# Patient Record
Sex: Female | Born: 1935 | Race: White | Hispanic: No | Marital: Married | Smoking: Never smoker
Health system: Southern US, Community
[De-identification: ages and names within clinical notes are randomized; demographics above are authoritative.]

## PROBLEM LIST (undated history)

## (undated) DIAGNOSIS — I340 Nonrheumatic mitral (valve) insufficiency: Secondary | ICD-10-CM

## (undated) DIAGNOSIS — I4510 Unspecified right bundle-branch block: Secondary | ICD-10-CM

## (undated) DIAGNOSIS — E039 Hypothyroidism, unspecified: Secondary | ICD-10-CM

## (undated) DIAGNOSIS — I493 Ventricular premature depolarization: Secondary | ICD-10-CM

## (undated) DIAGNOSIS — J302 Other seasonal allergic rhinitis: Secondary | ICD-10-CM

## (undated) DIAGNOSIS — I10 Essential (primary) hypertension: Secondary | ICD-10-CM

## (undated) HISTORY — PX: ABDOMINAL HYSTERECTOMY: SHX81

## (undated) HISTORY — PX: ROTATOR CUFF REPAIR: SHX139

## (undated) HISTORY — PX: CATARACT EXTRACTION: SUR2

## (undated) HISTORY — PX: OTHER SURGICAL HISTORY: SHX169

---

## 2005-02-08 ENCOUNTER — Ambulatory Visit: Payer: Self-pay | Admitting: Internal Medicine

## 2005-07-15 ENCOUNTER — Ambulatory Visit: Payer: Self-pay | Admitting: Internal Medicine

## 2005-08-08 ENCOUNTER — Ambulatory Visit: Payer: Self-pay | Admitting: Internal Medicine

## 2005-08-16 ENCOUNTER — Ambulatory Visit: Payer: Self-pay | Admitting: Gastroenterology

## 2006-01-05 ENCOUNTER — Ambulatory Visit: Payer: Self-pay | Admitting: Internal Medicine

## 2006-01-16 ENCOUNTER — Ambulatory Visit: Payer: Self-pay | Admitting: Internal Medicine

## 2006-02-10 ENCOUNTER — Ambulatory Visit: Payer: Self-pay

## 2006-03-13 ENCOUNTER — Ambulatory Visit: Payer: Self-pay | Admitting: Internal Medicine

## 2006-03-18 ENCOUNTER — Ambulatory Visit: Payer: Self-pay | Admitting: Internal Medicine

## 2006-05-12 ENCOUNTER — Ambulatory Visit: Payer: Self-pay | Admitting: Internal Medicine

## 2006-05-19 ENCOUNTER — Ambulatory Visit: Payer: Self-pay | Admitting: Internal Medicine

## 2006-07-11 ENCOUNTER — Ambulatory Visit: Payer: Self-pay | Admitting: Internal Medicine

## 2006-07-18 ENCOUNTER — Ambulatory Visit: Payer: Self-pay | Admitting: Internal Medicine

## 2007-02-14 ENCOUNTER — Ambulatory Visit: Payer: Self-pay | Admitting: Internal Medicine

## 2007-10-15 ENCOUNTER — Ambulatory Visit: Payer: Self-pay | Admitting: Internal Medicine

## 2008-02-15 ENCOUNTER — Ambulatory Visit: Payer: Self-pay | Admitting: Internal Medicine

## 2009-02-16 ENCOUNTER — Ambulatory Visit: Payer: Self-pay | Admitting: Internal Medicine

## 2010-01-06 ENCOUNTER — Ambulatory Visit: Payer: Self-pay | Admitting: Ophthalmology

## 2010-01-12 ENCOUNTER — Ambulatory Visit: Payer: Self-pay | Admitting: Internal Medicine

## 2010-01-15 ENCOUNTER — Ambulatory Visit: Payer: Self-pay | Admitting: Cardiovascular Disease

## 2010-02-01 ENCOUNTER — Ambulatory Visit: Payer: Self-pay | Admitting: Ophthalmology

## 2010-02-18 ENCOUNTER — Ambulatory Visit: Payer: Self-pay | Admitting: Internal Medicine

## 2010-06-07 ENCOUNTER — Ambulatory Visit: Payer: Self-pay | Admitting: Ophthalmology

## 2011-03-04 ENCOUNTER — Ambulatory Visit: Payer: Self-pay | Admitting: Internal Medicine

## 2012-03-06 ENCOUNTER — Ambulatory Visit: Payer: Self-pay | Admitting: Internal Medicine

## 2013-03-11 ENCOUNTER — Ambulatory Visit: Payer: Self-pay | Admitting: Internal Medicine

## 2014-03-17 ENCOUNTER — Ambulatory Visit: Payer: Self-pay

## 2014-03-25 ENCOUNTER — Ambulatory Visit: Payer: Self-pay

## 2014-09-22 ENCOUNTER — Other Ambulatory Visit: Payer: Self-pay | Admitting: Nurse Practitioner

## 2014-09-22 DIAGNOSIS — R921 Mammographic calcification found on diagnostic imaging of breast: Secondary | ICD-10-CM

## 2015-03-19 ENCOUNTER — Ambulatory Visit: Payer: Self-pay

## 2015-03-19 ENCOUNTER — Other Ambulatory Visit: Payer: Self-pay

## 2015-03-23 ENCOUNTER — Other Ambulatory Visit: Payer: Self-pay | Admitting: Nurse Practitioner

## 2015-03-23 ENCOUNTER — Ambulatory Visit
Admission: RE | Admit: 2015-03-23 | Discharge: 2015-03-23 | Disposition: A | Discharge status: Home / Self Care | Payer: Commercial Managed Care - HMO | Source: Ambulatory Visit | Attending: Nurse Practitioner | Admitting: Nurse Practitioner

## 2015-03-23 DIAGNOSIS — R921 Mammographic calcification found on diagnostic imaging of breast: Secondary | ICD-10-CM

## 2016-03-07 ENCOUNTER — Other Ambulatory Visit: Payer: Self-pay | Admitting: Internal Medicine

## 2016-03-07 DIAGNOSIS — R921 Mammographic calcification found on diagnostic imaging of breast: Secondary | ICD-10-CM

## 2016-04-12 ENCOUNTER — Other Ambulatory Visit: Payer: Self-pay | Admitting: Internal Medicine

## 2016-04-12 ENCOUNTER — Ambulatory Visit
Admission: RE | Admit: 2016-04-12 | Discharge: 2016-04-12 | Disposition: A | Discharge status: Home / Self Care | Payer: Commercial Managed Care - HMO | Source: Ambulatory Visit | Attending: Internal Medicine | Admitting: Internal Medicine

## 2016-04-12 DIAGNOSIS — R921 Mammographic calcification found on diagnostic imaging of breast: Secondary | ICD-10-CM

## 2016-04-14 ENCOUNTER — Other Ambulatory Visit: Payer: Self-pay | Admitting: Internal Medicine

## 2016-04-14 DIAGNOSIS — R928 Other abnormal and inconclusive findings on diagnostic imaging of breast: Secondary | ICD-10-CM

## 2016-04-14 DIAGNOSIS — R921 Mammographic calcification found on diagnostic imaging of breast: Secondary | ICD-10-CM

## 2016-04-27 ENCOUNTER — Ambulatory Visit
Admission: RE | Admit: 2016-04-27 | Discharge: 2016-04-27 | Disposition: A | Discharge status: Home / Self Care | Payer: Medicare HMO | Source: Ambulatory Visit | Attending: Internal Medicine | Admitting: Internal Medicine

## 2016-04-27 DIAGNOSIS — R928 Other abnormal and inconclusive findings on diagnostic imaging of breast: Secondary | ICD-10-CM

## 2016-04-27 DIAGNOSIS — R921 Mammographic calcification found on diagnostic imaging of breast: Secondary | ICD-10-CM

## 2016-04-27 HISTORY — PX: BREAST BIOPSY: SHX20

## 2016-04-28 LAB — SURGICAL PATHOLOGY

## 2016-12-13 ENCOUNTER — Other Ambulatory Visit: Payer: Self-pay | Admitting: Internal Medicine

## 2016-12-13 DIAGNOSIS — R270 Ataxia, unspecified: Secondary | ICD-10-CM

## 2016-12-30 ENCOUNTER — Ambulatory Visit
Admission: RE | Admit: 2016-12-30 | Discharge: 2016-12-30 | Disposition: A | Discharge status: Home / Self Care | Payer: Medicare HMO | Source: Ambulatory Visit | Attending: Internal Medicine | Admitting: Internal Medicine

## 2016-12-30 DIAGNOSIS — R270 Ataxia, unspecified: Secondary | ICD-10-CM | POA: Insufficient documentation

## 2017-04-20 ENCOUNTER — Other Ambulatory Visit: Payer: Self-pay | Admitting: Internal Medicine

## 2017-04-20 DIAGNOSIS — Z1231 Encounter for screening mammogram for malignant neoplasm of breast: Secondary | ICD-10-CM

## 2017-05-15 ENCOUNTER — Ambulatory Visit
Admission: RE | Admit: 2017-05-15 | Discharge: 2017-05-15 | Disposition: A | Discharge status: Home / Self Care | Payer: Medicare HMO | Source: Ambulatory Visit | Attending: Internal Medicine | Admitting: Internal Medicine

## 2017-05-15 DIAGNOSIS — Z1231 Encounter for screening mammogram for malignant neoplasm of breast: Secondary | ICD-10-CM | POA: Insufficient documentation

## 2018-04-23 ENCOUNTER — Other Ambulatory Visit: Payer: Self-pay | Admitting: Internal Medicine

## 2018-04-23 DIAGNOSIS — Z1231 Encounter for screening mammogram for malignant neoplasm of breast: Secondary | ICD-10-CM

## 2018-05-22 ENCOUNTER — Ambulatory Visit
Admission: RE | Admit: 2018-05-22 | Discharge: 2018-05-22 | Disposition: A | Discharge status: Home / Self Care | Payer: Medicare HMO | Source: Ambulatory Visit | Attending: Internal Medicine | Admitting: Internal Medicine

## 2018-05-22 DIAGNOSIS — Z1231 Encounter for screening mammogram for malignant neoplasm of breast: Secondary | ICD-10-CM | POA: Diagnosis not present

## 2018-07-01 ENCOUNTER — Inpatient Hospital Stay
Admission: EM | Admit: 2018-07-01 | Discharge: 2018-07-03 | DRG: 287 | Disposition: A | Discharge status: Home / Self Care | Payer: Medicare HMO | Attending: Internal Medicine | Admitting: Internal Medicine

## 2018-07-01 ENCOUNTER — Other Ambulatory Visit: Payer: Self-pay

## 2018-07-01 ENCOUNTER — Emergency Department: Payer: Medicare HMO

## 2018-07-01 DIAGNOSIS — Z7982 Long term (current) use of aspirin: Secondary | ICD-10-CM

## 2018-07-01 DIAGNOSIS — I248 Other forms of acute ischemic heart disease: Secondary | ICD-10-CM | POA: Diagnosis present

## 2018-07-01 DIAGNOSIS — I10 Essential (primary) hypertension: Secondary | ICD-10-CM | POA: Diagnosis present

## 2018-07-01 DIAGNOSIS — F4320 Adjustment disorder, unspecified: Secondary | ICD-10-CM | POA: Diagnosis present

## 2018-07-01 DIAGNOSIS — Z23 Encounter for immunization: Secondary | ICD-10-CM

## 2018-07-01 DIAGNOSIS — I5181 Takotsubo syndrome: Secondary | ICD-10-CM | POA: Diagnosis not present

## 2018-07-01 DIAGNOSIS — J302 Other seasonal allergic rhinitis: Secondary | ICD-10-CM | POA: Diagnosis present

## 2018-07-01 DIAGNOSIS — I214 Non-ST elevation (NSTEMI) myocardial infarction: Secondary | ICD-10-CM | POA: Diagnosis present

## 2018-07-01 DIAGNOSIS — R072 Precordial pain: Secondary | ICD-10-CM

## 2018-07-01 DIAGNOSIS — Z79890 Hormone replacement therapy (postmenopausal): Secondary | ICD-10-CM

## 2018-07-01 DIAGNOSIS — E039 Hypothyroidism, unspecified: Secondary | ICD-10-CM | POA: Diagnosis present

## 2018-07-01 DIAGNOSIS — Z79899 Other long term (current) drug therapy: Secondary | ICD-10-CM

## 2018-07-01 DIAGNOSIS — Z90710 Acquired absence of both cervix and uterus: Secondary | ICD-10-CM

## 2018-07-01 DIAGNOSIS — E782 Mixed hyperlipidemia: Secondary | ICD-10-CM | POA: Diagnosis present

## 2018-07-01 DIAGNOSIS — Z8249 Family history of ischemic heart disease and other diseases of the circulatory system: Secondary | ICD-10-CM

## 2018-07-01 DIAGNOSIS — R079 Chest pain, unspecified: Secondary | ICD-10-CM | POA: Diagnosis present

## 2018-07-01 HISTORY — DX: Other seasonal allergic rhinitis: J30.2

## 2018-07-01 HISTORY — DX: Ventricular premature depolarization: I49.3

## 2018-07-01 HISTORY — DX: Hypothyroidism, unspecified: E03.9

## 2018-07-01 HISTORY — DX: Unspecified right bundle-branch block: I45.10

## 2018-07-01 HISTORY — DX: Essential (primary) hypertension: I10

## 2018-07-01 HISTORY — DX: Nonrheumatic mitral (valve) insufficiency: I34.0

## 2018-07-01 LAB — CBC
HCT: 34.3 % — ABNORMAL LOW (ref 36.0–46.0)
Hemoglobin: 11.3 g/dL — ABNORMAL LOW (ref 12.0–15.0)
MCH: 28.8 pg (ref 26.0–34.0)
MCHC: 32.9 g/dL (ref 30.0–36.0)
MCV: 87.3 fL (ref 80.0–100.0)
Platelets: 166 10*3/uL (ref 150–400)
RBC: 3.93 MIL/uL (ref 3.87–5.11)
RDW: 13.7 % (ref 11.5–15.5)
WBC: 6 10*3/uL (ref 4.0–10.5)
nRBC: 0 % (ref 0.0–0.2)

## 2018-07-01 LAB — BASIC METABOLIC PANEL
Anion gap: 8 (ref 5–15)
BUN: 30 mg/dL — ABNORMAL HIGH (ref 8–23)
CALCIUM: 9.5 mg/dL (ref 8.9–10.3)
CO2: 27 mmol/L (ref 22–32)
CREATININE: 0.98 mg/dL (ref 0.44–1.00)
Chloride: 104 mmol/L (ref 98–111)
GFR calc Af Amer: >60 mL/min (ref >60)
GFR calc non Af Amer: 54 mL/min — ABNORMAL LOW (ref >60)
Glucose, Bld: 122 mg/dL — ABNORMAL HIGH (ref 70–99)
Potassium: 4 mmol/L (ref 3.5–5.1)
Sodium: 139 mmol/L (ref 135–145)

## 2018-07-01 LAB — TROPONIN I: Troponin I: 0.18 ng/mL (ref <0.03)

## 2018-07-01 MED ORDER — SODIUM CHLORIDE 0.9% FLUSH: 3.0000 mL | Freq: Once | INTRAVENOUS | Status: DC

## 2018-07-01 NOTE — ED Notes (Signed)
Provider made aware of trop of 0.18.

## 2018-07-01 NOTE — ED Provider Notes (Signed)
[facility] Emergency Department Provider Note  ____________________________________________  Time seen: Approximately 11:51 PM  I have reviewed the triage vital signs and the nursing notes.   HISTORY  Chief Complaint Chest Pain    HPI Michele [name] is a 84 y.o. female with a past medical history of hypothyroidism and hypertension who reports being in her usual state of health until about 9:00 PM when she learned that her son had committed suicide.  She was so upset and immediately started having central chest pain described as pressure, nonradiating, no aggravating or alleviating factors.  No diaphoresis vomiting or shortness of breath.  Never had pain like this before.  No dizziness or syncope.  She has been experiencing skipped heartbeats over the past few weeks.  No recent cardiac evaluation.      Past medical history includes hypertension and hypothyroidism   There are no active problems to display for this patient.    Past Surgical History:  Procedure Laterality Date  . BREAST BIOPSY Left 04/27/2016   FIBROADENOMATOUS CHANGES WITH STROMAL HYALINIZATION AND      Prior to Admission medications   Medication Sig Start Date End Date Taking? Authorizing Provider  amLODipine (NORVASC) 10 MG tablet Take 10 mg by mouth daily. 06/01/18  Yes [provider]  aspirin EC 81 MG tablet Take 81 mg by mouth daily.   Yes [provider]  levothyroxine (SYNTHROID, LEVOTHROID) 75 MCG tablet Take 75 mcg by mouth daily. 06/01/18  Yes [provider]     Allergies Patient has no known allergies.   Family History  Problem Relation Age of Onset  . Breast cancer Cousin     Social History Social History   Tobacco Use  . Smoking status: Not on file  Substance Use Topics  . Alcohol use: Not on file  . Drug use: Not on file  No tobacco or alcohol use.  Review of Systems  Constitutional:   No fever or chills.  ENT:   No sore  throat. No rhinorrhea. Cardiovascular: Positive as above chest pain without syncope. Respiratory:   No dyspnea or cough. Gastrointestinal:   Negative for abdominal pain, vomiting and diarrhea.  Musculoskeletal:   Negative for focal pain or swelling All other systems reviewed and are negative except as documented above in ROS and HPI.  ____________________________________________   PHYSICAL EXAM:  VITAL SIGNS: ED Triage Vitals  Enc Vitals Group     BP 07/01/18 2230 (!) 157/67     Pulse Rate 07/01/18 2230 (!) 28     Resp 07/01/18 2230 18     Temp --      Temp src --      SpO2 07/01/18 2230 99 %     Weight 07/01/18 2224 130 lb (59 kg)     Height 07/01/18 2224 5' (1.524 m)     Head Circumference --      Peak Flow --      Pain Score 07/01/18 2223 8     Pain Loc --      Pain Edu? --      Excl. in GC? --     Vital signs reviewed, nursing assessments reviewed.   Constitutional:   Alert and oriented. Non-toxic appearance. Eyes:   Conjunctivae are normal. EOMI. PERRL. ENT      Head:   Normocephalic and atraumatic.      Nose:   No congestion/rhinnorhea.       Mouth/Throat:   MMM, no pharyngeal erythema.  No peritonsillar mass.       Neck:   No meningismus. Full ROM.  Neuro nonpalpable Hematological/Lymphatic/Immunilogical:   No cervical lymphadenopathy. Cardiovascular:   Tachycardia heart rate 105.  Frequent PVCs on the monitor. Symmetric bilateral radial and DP pulses.  No murmurs. Cap refill less than 2 seconds. Respiratory:   Normal respiratory effort without tachypnea/retractions. Breath sounds are clear and equal bilaterally. No wheezes/rales/rhonchi. Gastrointestinal:   Soft and nontender. Non distended. There is no CVA tenderness.  No rebound, rigidity, or guarding.  Musculoskeletal:   Normal range of motion in all extremities. No joint effusions.  No lower extremity tenderness.  No edema. Neurologic:   Normal speech and language.  Motor grossly intact. No acute focal  neurologic deficits are appreciated.  Skin:    Skin is warm, dry and intact. No rash noted.  No petechiae, purpura, or bullae.  ____________________________________________    LABS (pertinent positives/negatives) (all labs ordered are listed, but only abnormal results are displayed) Labs Reviewed  BASIC METABOLIC PANEL - Abnormal; Notable for the following components:      Result Value   Glucose, Bld 122 (*)    BUN 30 (*)    GFR calc non Af Amer 54 (*)    All other components within normal limits  CBC - Abnormal; Notable for the following components:   Hemoglobin 11.3 (*)    HCT 34.3 (*)    All other components within normal limits  TROPONIN I - Abnormal; Notable for the following components:   Troponin I 0.18 (*)    All other components within normal limits  MAGNESIUM   ____________________________________________   EKG  Interpreted by me Sinus tachycardia rate 103, right axis, right bundle branch block.  3 PVCs on the strip, no acute ischemic changes.  ____________________________________________    RADIOLOGY  Dg Chest 2 View  Result Date: 07/01/2018 CLINICAL DATA:  Substernal chest pain EXAM: CHEST - 2 VIEW COMPARISON:  01/12/2010 FINDINGS: There is hyperinflation of the lungs compatible with COPD. Scarring in the upper lobes and lung bases bilaterally. No acute confluent airspace opacities. Heart is borderline in size. No effusions or acute bony abnormality. IMPRESSION: COPD/chronic changes.  No active disease. Electronically Signed   By: Charlett NoseKevin  Dover M.D.   On: 07/01/2018 22:51    ____________________________________________   PROCEDURES .Critical Care Performed by: Sharman CheekStafford, Karmine Kauer, MD Authorized by: Sharman CheekStafford, Ranie Chinchilla, MD   Critical care provider statement:    Critical care time (minutes):  30   Critical care time was exclusive of:  Separately billable procedures and treating other patients   Critical care was necessary to treat or prevent imminent or  life-threatening deterioration of the following conditions:  Cardiac failure   Critical care was time spent personally by me on the following activities:  Development of treatment plan with patient or surrogate, discussions with consultants, evaluation of patient's response to treatment, examination of patient, obtaining history from patient or surrogate, ordering and performing treatments and interventions, ordering and review of laboratory studies, ordering and review of radiographic studies, pulse oximetry, re-evaluation of patient's condition and review of old charts    ____________________________________________  DIFFERENTIAL DIAGNOSIS   Non-STEMI, Takotsubo cardiomyopathy, acute stress reaction, hyperthyroidism  CLINICAL IMPRESSION / ASSESSMENT AND PLAN / ED COURSE  Medications ordered in the ED: Medications  sodium chloride flush (NS) 0.9 % injection 3 mL (has no administration in time range)    Pertinent labs & imaging results that were available during my care of the patient were  reviewed by me and considered in my medical decision making (see chart for details).    Patient presents with central chest pain starting at 9 PM tonight after learning of her son's suicide.  Given aspirin by EMS.  Ongoing chest pain now, troponin elevated to 0.18, patient will require overnight heparin, serial troponin, cardiac work-up.  Will discuss with hospitalist for further evaluation.      ____________________________________________   FINAL CLINICAL IMPRESSION(S) / ED DIAGNOSES    Final diagnoses:  NSTEMI (non-ST elevated myocardial infarction) (HCC)  Precordial pain     ED Discharge Orders    None      Portions of this note were generated with dragon dictation software. Dictation errors may occur despite best attempts at proofreading.   Sharman Cheek, MD 07/01/18 2355

## 2018-07-01 NOTE — ED Notes (Signed)
Unhooked pt from monitor to use bathroom. Pt had steady gait and did not need assistance. Hooked pt back up to monitor when done.

## 2018-07-01 NOTE — ED Notes (Signed)
Pt stated that she started having substernal chest pain that started around 2100. Pt stated that the sheriff officer came to her house and informed her that her son was dead. Pt stated that the pain has been constant since then but will easy up some. Denies any n/v or SOB. Pt stated that earlier this month she went to her PCP and he told her that her heart was skipping beats and she does have a cardiology appt next month.

## 2018-07-01 NOTE — ED Triage Notes (Signed)
Pt arrived via [facility] from home with substernal CP after being informed that her son has died. Pt was supposed to see cardiology later next week. Pt visibly upset but cooperative.

## 2018-07-02 ENCOUNTER — Encounter
Admission: EM | Disposition: A | Discharge status: Home / Self Care | Payer: Self-pay | Source: Home / Self Care | Attending: Internal Medicine

## 2018-07-02 ENCOUNTER — Encounter: Payer: Self-pay | Admitting: Family Medicine

## 2018-07-02 ENCOUNTER — Other Ambulatory Visit: Payer: Self-pay

## 2018-07-02 DIAGNOSIS — R072 Precordial pain: Secondary | ICD-10-CM | POA: Diagnosis present

## 2018-07-02 DIAGNOSIS — R079 Chest pain, unspecified: Secondary | ICD-10-CM | POA: Diagnosis not present

## 2018-07-02 DIAGNOSIS — I5181 Takotsubo syndrome: Secondary | ICD-10-CM | POA: Diagnosis present

## 2018-07-02 DIAGNOSIS — E782 Mixed hyperlipidemia: Secondary | ICD-10-CM | POA: Diagnosis present

## 2018-07-02 DIAGNOSIS — I10 Essential (primary) hypertension: Secondary | ICD-10-CM | POA: Diagnosis present

## 2018-07-02 DIAGNOSIS — Z8249 Family history of ischemic heart disease and other diseases of the circulatory system: Secondary | ICD-10-CM | POA: Diagnosis not present

## 2018-07-02 DIAGNOSIS — Z79890 Hormone replacement therapy (postmenopausal): Secondary | ICD-10-CM | POA: Diagnosis not present

## 2018-07-02 DIAGNOSIS — I214 Non-ST elevation (NSTEMI) myocardial infarction: Secondary | ICD-10-CM | POA: Diagnosis present

## 2018-07-02 DIAGNOSIS — Z90710 Acquired absence of both cervix and uterus: Secondary | ICD-10-CM | POA: Diagnosis not present

## 2018-07-02 DIAGNOSIS — E039 Hypothyroidism, unspecified: Secondary | ICD-10-CM | POA: Diagnosis present

## 2018-07-02 DIAGNOSIS — Z23 Encounter for immunization: Secondary | ICD-10-CM | POA: Diagnosis present

## 2018-07-02 DIAGNOSIS — J302 Other seasonal allergic rhinitis: Secondary | ICD-10-CM | POA: Diagnosis present

## 2018-07-02 DIAGNOSIS — R7989 Other specified abnormal findings of blood chemistry: Secondary | ICD-10-CM | POA: Diagnosis not present

## 2018-07-02 DIAGNOSIS — F4320 Adjustment disorder, unspecified: Secondary | ICD-10-CM | POA: Diagnosis present

## 2018-07-02 DIAGNOSIS — Z7982 Long term (current) use of aspirin: Secondary | ICD-10-CM | POA: Diagnosis not present

## 2018-07-02 DIAGNOSIS — I248 Other forms of acute ischemic heart disease: Secondary | ICD-10-CM | POA: Diagnosis present

## 2018-07-02 DIAGNOSIS — Z79899 Other long term (current) drug therapy: Secondary | ICD-10-CM | POA: Diagnosis not present

## 2018-07-02 HISTORY — PX: LEFT HEART CATH AND CORONARY ANGIOGRAPHY: CATH118249

## 2018-07-02 LAB — TROPONIN I
Troponin I: 3.36 ng/mL (ref <0.03)
Troponin I: 3.15 ng/mL (ref <0.03)

## 2018-07-02 LAB — HEPATIC FUNCTION PANEL
ALT: 20 U/L (ref 0–44)
AST: 32 U/L (ref 15–41)
Albumin: 4.2 g/dL (ref 3.5–5.0)
Alkaline Phosphatase: 47 U/L (ref 38–126)
Bilirubin, Direct: 0.1 mg/dL (ref 0.0–0.2)
Indirect Bilirubin: 0.2 mg/dL — ABNORMAL LOW (ref 0.3–0.9)
Total Bilirubin: 0.3 mg/dL (ref 0.3–1.2)
Total Protein: 6.5 g/dL (ref 6.5–8.1)

## 2018-07-02 LAB — CBC
HCT: 31.5 % — ABNORMAL LOW (ref 36.0–46.0)
HEMOGLOBIN: 10.4 g/dL — AB (ref 12.0–15.0)
MCH: 28.7 pg (ref 26.0–34.0)
MCHC: 33 g/dL (ref 30.0–36.0)
MCV: 86.8 fL (ref 80.0–100.0)
Platelets: 159 10*3/uL (ref 150–400)
RBC: 3.63 MIL/uL — ABNORMAL LOW (ref 3.87–5.11)
RDW: 13.8 % (ref 11.5–15.5)
WBC: 7.3 10*3/uL (ref 4.0–10.5)
nRBC: 0 % (ref 0.0–0.2)

## 2018-07-02 LAB — MAGNESIUM: Magnesium: 2.2 mg/dL (ref 1.7–2.4)

## 2018-07-02 LAB — LIPID PANEL
CHOL/HDL RATIO: 1.8 ratio
Cholesterol: 162 mg/dL (ref 0–200)
HDL: 91 mg/dL (ref >40)
LDL Cholesterol: 66 mg/dL (ref 0–99)
Triglycerides: 27 mg/dL (ref <150)
VLDL: 5 mg/dL (ref 0–40)

## 2018-07-02 LAB — APTT: APTT: 38 s — AB (ref 24–36)

## 2018-07-02 LAB — TSH: TSH: 2.541 u[IU]/mL (ref 0.350–4.500)

## 2018-07-02 LAB — PHOSPHORUS: Phosphorus: 3.2 mg/dL (ref 2.5–4.6)

## 2018-07-02 LAB — PROTIME-INR
INR: 1 (ref 0.8–1.2)
Prothrombin Time: 12.8 seconds (ref 11.4–15.2)

## 2018-07-02 LAB — BRAIN NATRIURETIC PEPTIDE: B Natriuretic Peptide: 109 pg/mL — ABNORMAL HIGH (ref 0.0–100.0)

## 2018-07-02 SURGERY — LEFT HEART CATH AND CORONARY ANGIOGRAPHY
Anesthesia: Moderate Sedation

## 2018-07-02 MED ORDER — SODIUM CHLORIDE 0.9% FLUSH: 3.0000 mL | INTRAVENOUS | Status: DC | PRN

## 2018-07-02 MED ORDER — MORPHINE SULFATE (PF) 2 MG/ML IV SOLN: 2.0000 mg | INTRAVENOUS | Status: DC | PRN

## 2018-07-02 MED ORDER — SODIUM CHLORIDE 0.9% FLUSH: 3.0000 mL | Freq: Two times a day (BID) | INTRAVENOUS | Status: DC

## 2018-07-02 MED ORDER — FUROSEMIDE 10 MG/ML IJ SOLN
20.0000 mg | Freq: Once | INTRAMUSCULAR | Status: AC
  Administered 2018-07-02: 20 mg via INTRAVENOUS

## 2018-07-02 MED ORDER — MORPHINE SULFATE (PF) 2 MG/ML IV SOLN
2.0000 mg | INTRAVENOUS | Status: DC | PRN
  Administered 2018-07-02: 2 mg via INTRAVENOUS
  Filled 2018-07-02: qty 1

## 2018-07-02 MED ORDER — ONDANSETRON HCL 4 MG/2ML IJ SOLN: 4.0000 mg | Freq: Four times a day (QID) | INTRAMUSCULAR | Status: DC | PRN

## 2018-07-02 MED ORDER — LEVOTHYROXINE SODIUM 50 MCG PO TABS
75.0000 ug | ORAL_TABLET | Freq: Every day | ORAL | Status: DC
  Administered 2018-07-02 – 2018-07-03 (×2): 75 ug via ORAL
  Filled 2018-07-02: qty 1
  Filled 2018-07-02: qty 2

## 2018-07-02 MED ORDER — PANTOPRAZOLE SODIUM 40 MG PO TBEC
40.0000 mg | DELAYED_RELEASE_TABLET | Freq: Every day | ORAL | Status: DC
  Administered 2018-07-03: 40 mg via ORAL
  Filled 2018-07-02: qty 1

## 2018-07-02 MED ORDER — PNEUMOCOCCAL VAC POLYVALENT 25 MCG/0.5ML IJ INJ
0.5000 mL | INJECTION | INTRAMUSCULAR | Status: AC
  Administered 2018-07-03: 0.5 mL via INTRAMUSCULAR
  Filled 2018-07-02: qty 0.5

## 2018-07-02 MED ORDER — NITROGLYCERIN 0.4 MG SL SUBL: 0.4000 mg | SUBLINGUAL_TABLET | SUBLINGUAL | Status: DC | PRN

## 2018-07-02 MED ORDER — SODIUM CHLORIDE 0.9 % WEIGHT BASED INFUSION
1.0000 mL/kg/h | INTRAVENOUS | Status: AC
  Administered 2018-07-02: 1 mL/kg/h via INTRAVENOUS

## 2018-07-02 MED ORDER — FENTANYL CITRATE (PF) 100 MCG/2ML IJ SOLN
INTRAMUSCULAR | Status: AC
  Filled 2018-07-02: qty 2

## 2018-07-02 MED ORDER — NITROGLYCERIN 2 % TD OINT
0.5000 [in_us] | TOPICAL_OINTMENT | Freq: Four times a day (QID) | TRANSDERMAL | Status: DC
  Administered 2018-07-02: 0.5 [in_us] via TOPICAL
  Filled 2018-07-02 (×2): qty 1

## 2018-07-02 MED ORDER — SODIUM CHLORIDE 0.9 % WEIGHT BASED INFUSION: 1.0000 mL/kg/h | INTRAVENOUS | Status: DC

## 2018-07-02 MED ORDER — HEPARIN (PORCINE) IN NACL 1000-0.9 UT/500ML-% IV SOLN
INTRAVENOUS | Status: DC | PRN
  Administered 2018-07-02 (×2): 500 mL

## 2018-07-02 MED ORDER — SODIUM CHLORIDE 0.9 % IV SOLN: 250.0000 mL | INTRAVENOUS | Status: DC | PRN

## 2018-07-02 MED ORDER — FUROSEMIDE 20 MG PO TABS: 20.0000 mg | ORAL_TABLET | Freq: Every day | ORAL | Status: DC

## 2018-07-02 MED ORDER — MIDAZOLAM HCL 2 MG/2ML IJ SOLN
INTRAMUSCULAR | Status: DC | PRN
  Administered 2018-07-02: 1 mg via INTRAVENOUS

## 2018-07-02 MED ORDER — AMLODIPINE BESYLATE 5 MG PO TABS: 10.0000 mg | ORAL_TABLET | Freq: Every day | ORAL | Status: DC

## 2018-07-02 MED ORDER — ACETAMINOPHEN 325 MG PO TABS: 650.0000 mg | ORAL_TABLET | ORAL | Status: DC | PRN

## 2018-07-02 MED ORDER — FENTANYL CITRATE (PF) 100 MCG/2ML IJ SOLN
INTRAMUSCULAR | Status: DC | PRN
  Administered 2018-07-02: 25 ug via INTRAVENOUS

## 2018-07-02 MED ORDER — MIDAZOLAM HCL 2 MG/2ML IJ SOLN
INTRAMUSCULAR | Status: AC
  Filled 2018-07-02: qty 2

## 2018-07-02 MED ORDER — MORPHINE SULFATE (PF) 4 MG/ML IV SOLN: 4.0000 mg | INTRAVENOUS | Status: DC | PRN

## 2018-07-02 MED ORDER — ASPIRIN EC 325 MG PO TBEC
325.0000 mg | DELAYED_RELEASE_TABLET | Freq: Every day | ORAL | Status: DC
  Administered 2018-07-03: 325 mg via ORAL
  Filled 2018-07-02: qty 1

## 2018-07-02 MED ORDER — ZOLPIDEM TARTRATE 5 MG PO TABS
5.0000 mg | ORAL_TABLET | Freq: Every evening | ORAL | Status: DC | PRN
  Filled 2018-07-02: qty 1

## 2018-07-02 MED ORDER — HEPARIN (PORCINE) 25000 UT/250ML-% IV SOLN
700.0000 [IU]/h | INTRAVENOUS | Status: DC
  Administered 2018-07-02: 700 [IU]/h via INTRAVENOUS
  Filled 2018-07-02: qty 250

## 2018-07-02 MED ORDER — ALPRAZOLAM 0.25 MG PO TABS: 0.2500 mg | ORAL_TABLET | Freq: Two times a day (BID) | ORAL | Status: DC | PRN

## 2018-07-02 MED ORDER — HEPARIN (PORCINE) IN NACL 1000-0.9 UT/500ML-% IV SOLN
INTRAVENOUS | Status: AC
  Filled 2018-07-02: qty 1000

## 2018-07-02 MED ORDER — FUROSEMIDE 10 MG/ML IJ SOLN
INTRAMUSCULAR | Status: AC
  Administered 2018-07-02: 20 mg via INTRAVENOUS
  Filled 2018-07-02: qty 2

## 2018-07-02 MED ORDER — ASPIRIN 81 MG PO CHEW: 81.0000 mg | CHEWABLE_TABLET | Freq: Every day | ORAL | Status: DC

## 2018-07-02 MED ORDER — LISINOPRIL 10 MG PO TABS
5.0000 mg | ORAL_TABLET | Freq: Every day | ORAL | Status: DC
  Administered 2018-07-02 – 2018-07-03 (×2): 5 mg via ORAL
  Filled 2018-07-02 (×2): qty 1

## 2018-07-02 MED ORDER — ATORVASTATIN CALCIUM 20 MG PO TABS
20.0000 mg | ORAL_TABLET | Freq: Every day | ORAL | Status: DC
  Administered 2018-07-02 (×2): 20 mg via ORAL
  Filled 2018-07-02 (×2): qty 1

## 2018-07-02 MED ORDER — SODIUM CHLORIDE 0.9 % IV SOLN
INTRAVENOUS | Status: DC
  Administered 2018-07-02: 08:00:00 via INTRAVENOUS

## 2018-07-02 MED ORDER — IOPAMIDOL (ISOVUE-300) INJECTION 61%
INTRAVENOUS | Status: DC | PRN
  Administered 2018-07-02: 100 mL via INTRA_ARTERIAL

## 2018-07-02 MED ORDER — SODIUM CHLORIDE 0.9 % WEIGHT BASED INFUSION: 3.0000 mL/kg/h | INTRAVENOUS | Status: DC

## 2018-07-02 MED ORDER — ASPIRIN 81 MG PO CHEW: 81.0000 mg | CHEWABLE_TABLET | ORAL | Status: DC

## 2018-07-02 MED ORDER — HEPARIN BOLUS VIA INFUSION
3600.0000 [IU] | Freq: Once | INTRAVENOUS | Status: AC
  Administered 2018-07-02: 3600 [IU] via INTRAVENOUS
  Filled 2018-07-02: qty 3600

## 2018-07-02 MED ORDER — METOPROLOL TARTRATE 25 MG PO TABS
25.0000 mg | ORAL_TABLET | Freq: Two times a day (BID) | ORAL | Status: DC
  Administered 2018-07-02 – 2018-07-03 (×3): 25 mg via ORAL
  Filled 2018-07-02 (×3): qty 1

## 2018-07-02 SURGICAL SUPPLY — 10 items
CATH INFINITI 5FR ANG PIGTAIL (CATHETERS) ×3 IMPLANT
CATH INFINITI 5FR JL4 (CATHETERS) ×3 IMPLANT
CATH INFINITI JR4 5F (CATHETERS) ×3 IMPLANT
DEVICE CLOSURE MYNXGRIP 5F (Vascular Products) ×3 IMPLANT
KIT MANI 3VAL PERCEP (MISCELLANEOUS) ×3 IMPLANT
NEEDLE PERC 18GX7CM (NEEDLE) ×3 IMPLANT
PACK CARDIAC CATH (CUSTOM PROCEDURE TRAY) ×3 IMPLANT
SHEATH AVANTI 5FR X 11CM (SHEATH) ×3 IMPLANT
WIRE GUIDERIGHT .035X150 (WIRE) ×3 IMPLANT
WIRE ROSEN-J .035X260CM (WIRE) IMPLANT

## 2018-07-02 NOTE — ED Notes (Signed)
This RN and [place], RN to bedside to do bedside handoff. Pt is alert and oriented. Pt denies any pain at this time, denies any needs at this time. Will continue to monitor for further patient needs.

## 2018-07-02 NOTE — ED Notes (Signed)
Provider Dr. Arville Care made aware of pts trop of 3.36

## 2018-07-02 NOTE — H&P (Signed)
[facility] [place] [facility]   PATIENT NAME: Michele [name]    MR#:  [id number]  DATE OF BIRTH:  05/25/1934  DATE OF ADMISSION:  07/01/2018  PRIMARY CARE PHYSICIAN: Marguarite Arbour, MD   REQUESTING/REFERRING PHYSICIAN: Alfonse Flavors, MD  CHIEF COMPLAINT:   Chief Complaint  Patient presents with  . Chest Pain    HISTORY OF PRESENT ILLNESS:  Michele [name]  is a 84 y.o. female with a known history of hypertension, hypothyroidism and seasonal allergies as well as mitral regurgitation, who presented to the emergency room with acute onset of midsternal chest pain felt as pressure and sharp pain and graded 8/10 in severity with no dyspnea or palpitations, nausea or vomiting or diaphoresis.  She denied any radiation of her pain.  Prior to pain onset that started at 9 PM she learned that her 61 year old depressed son unfortunately committed suicide.  She stated that he could not get any help.  She denies any cough or wheezing or hemoptysis.  No leg pain or edema or recent travels or surgeries.  No bleeding diathesis.  She denied any sick exposure however she has been to her church yesterday morning.  She stated that the gathering was less than 100 members in triage.  She denies any fever or chills.  No rhinorrhea or nasal congestion or sore throat or earache.  Upon presentation to the emergency room, blood pressure was 157/67, with a heart rate of 110 and later 95, respiratory rate of 18 and pulse ox entry 99% on room air.  EKG showed sinus tachycardia with rate 103, with old right bundle branch block, right axis deviation and VCs which have been seen before on previous EKG..  Her view chest x-ray showed hyperinflation with no acute cardiopulmonary disease.  Her labs were remarkable for a troponin of 0.18 and her BUN was 30 with a magnesium of 2.2 and PTT of 38 otherwise labs where within normal.  She was given 4 baby aspirin by EMS.  Here she stated that her pain is down to  6/10.  She has been given sublingual nitroglycerin.  She was also ordered a bolus of IV heparin followed by IV heparin infusion.  She will be admitted to an observation telemetry bed for further evaluation and management. PAST MEDICAL HISTORY:   Past Medical History:  Diagnosis Date  . Hypertension   . Hypothyroidism   . Mitral regurgitation   . PVC's (premature ventricular contractions)   . Right bundle branch block   . Seasonal allergies    PAST SURGICAL HISTORY:   Past Surgical History:  Procedure Laterality Date  . ABDOMINAL HYSTERECTOMY    . BREAST BIOPSY Left 04/27/2016   FIBROADENOMATOUS CHANGES WITH STROMAL HYALINIZATION AND   . CATARACT EXTRACTION    . Right elbow tendonitis    . ROTATOR CUFF REPAIR Bilateral      SOCIAL HISTORY:   Social History   Tobacco Use  . Smoking status: Not on file  Substance Use Topics  . Alcohol use: Not on file  No history of illicit substance use.  FAMILY HISTORY:   Family History  Problem Relation Age of Onset  . Breast cancer Cousin   Her brother had MI in his late 99s and her mother had CVA  DRUG ALLERGIES:  No Known Allergies  REVIEW OF SYSTEMS:   ROS As per history of present illness. All pertinent systems were reviewed above. Constitutional,  HEENT, cardiovascular, respiratory, GI, GU, musculoskeletal, neuro, psychiatric,  endocrine,  integumentary and hematologic systems were reviewed and are otherwise  negative/unremarkable except for positive findings mentioned above in the HPI.   MEDICATIONS AT HOME:   Prior to Admission medications   Medication Sig Start Date End Date Taking? Authorizing Provider  amLODipine (NORVASC) 10 MG tablet Take 10 mg by mouth daily. 06/01/18  Yes [provider]  aspirin EC 81 MG tablet Take 81 mg by mouth daily.   Yes [provider]  levothyroxine (SYNTHROID, LEVOTHROID) 75 MCG tablet Take 75 mcg by mouth daily. 06/01/18  Yes [provider]      VITAL  SIGNS:  Blood pressure (!) 131/51, pulse 87, resp. rate 17, height 5' (1.524 m), weight 59 kg, SpO2 99 %.  PHYSICAL EXAMINATION:  Physical Exam  GENERAL:  84 y.o.-year-old Caucasian female patient lying in the bed with no acute distress.  EYES: Pupils equal, round, reactive to light and accommodation. No scleral icterus. Extraocular muscles intact.  HEENT: Head atraumatic, normocephalic. Oropharynx and nasopharynx clear.  NECK:  Supple, no jugular venous distention. No thyroid enlargement, no tenderness.  LUNGS: Normal breath sounds bilaterally, no wheezing, rales,rhonchi or crepitation. No use of accessory muscles of respiration.  CARDIOVASCULAR: Regular rate and rhythm with occasional extra beat, S1, S2 normal. No murmurs, rubs, or gallops.  ABDOMEN: Soft, nontender, nondistended. Bowel sounds present. No organomegaly or mass.  EXTREMITIES: No pedal edema, cyanosis, or clubbing.  NEUROLOGIC: Cranial nerves II through XII are intact. Muscle strength 5/5 in all extremities. Sensation intact. Gait not checked.  PSYCHIATRIC: The patient is alert and oriented x 3.  She has depressed affect. SKIN: No obvious rash, lesion, or ulcer.   LABORATORY PANEL:   CBC Recent Labs  Lab 07/01/18 2228  WBC 6.0  HGB 11.3*  HCT 34.3*  PLT 166   ------------------------------------------------------------------------------------------------------------------  Chemistries  Recent Labs  Lab 06/30/18 2228 07/01/18 2228  NA  --  139  K  --  4.0  CL  --  104  CO2  --  27  GLUCOSE  --  122*  BUN  --  30*  CREATININE  --  0.98  CALCIUM  --  9.5  MG 2.2  --    ------------------------------------------------------------------------------------------------------------------  Cardiac Enzymes Recent Labs  Lab 07/01/18 2228  TROPONINI 0.18*   ------------------------------------------------------------------------------------------------------------------  RADIOLOGY:  Dg Chest 2 View   Result Date: 07/01/2018 CLINICAL DATA:  Substernal chest pain EXAM: CHEST - 2 VIEW COMPARISON:  01/12/2010 FINDINGS: There is hyperinflation of the lungs compatible with COPD. Scarring in the upper lobes and lung bases bilaterally. No acute confluent airspace opacities. Heart is borderline in size. No effusions or acute bony abnormality. IMPRESSION: COPD/chronic changes.  No active disease. Electronically Signed   By: Charlett Nose M.D.   On: 07/01/2018 22:51    IMPRESSION AND PLAN:   #1.  Chest pain with associated elevated troponin I, rule out non-STEMI/ACS.  The patient will be admitted to an observation telemetry bed.  We will follow serial cardiac enzymes and EKGs.  Her EKG shows a right bundle branch block as well as PVCs which have been seen in previous ones.  She will be placed on increased dose of aspirin as well as PRN sublingual nitroglycerin and Nitropaste and if tolerated IV morphine sulfate.  She will be continued for now on IV heparin infusion overnight.  Will obtain a cardiology consultation by Dr. Gwen Pounds whom she was supposed to see on an outpatient basis in the near future.  We will  add beta-blocker therapy with Lopressor.  Will check statin therapy and start her on p.o. Lipitor.  Will obtain a 2D echo this a.m. and defer further cardiac risk stratification to Dr. Gwen Pounds.  Her pain is certainly be related to the horrible news she had regarding her 106 year old depressed son committing suicide tonight.  She will be placed on PRN Xanax while here and will likely need further evaluation by psychiatry on outpatient basis  I contacted Dr. Gwen Pounds regarding the consultation.  2.  Hypertension.  We will continue her Norvasc.  3.  Hypothyroidism.  We will check her TSH and continue her Synthroid.  4.  Seasonal allergies.  No current flare.  5.  DVT prophylaxis.  She has been placed on IV heparin infusion after she was given a bolus in the ER.  6.  GI prophylaxis will be provided with  PPI therapy for the possibility of GI etiology.  All the records are reviewed and case discussed with ED provider. Management plans discussed with the patient and her husband all questions were answered.  They agreed to proceed with the above-mentioned plan.  Further management will depend upon hospital course.   CODE STATUS: Full code.  I discussed it with her and her husband.  She does not want to stay for [place] time on life support though.   TOTAL TIME TAKING CARE OF THIS PATIENT: 60 minutes.    Hannah Beat M.D on 07/02/2018 at 1:41 AM  Pager - [phone]  After 6pm go to [email or web address] - Social research officer, government  [facility]  Office  [phone]  CC: Primary care physician; Marguarite Arbour, MD   Note: This dictation was prepared with Dragon dictation along with smaller phrase technology. Any transcriptional errors that result from this process are unintentional.

## 2018-07-02 NOTE — Consult Note (Signed)
[facility] Cardiology Consultation Note  Patient ID: Michele [name], MRN: [id number], DOB/AGE: Oct 10, 1934 84 y.o. Admit date: 07/01/2018   Date of Consult: 07/02/2018 Primary Physician: Marguarite Arbour, MD Primary Cardiologist: Gwen Pounds  Chief Complaint:  Chief Complaint  Patient presents with  . Chest Pain   Reason for Consult: Chest pain  HPI: 84 y.o. female with known essential hypertension mixed hyperlipidemia to a minimal degree but no evidence of previous cardiovascular history having recent increase in preventricular contractions and abnormal EKG of right bundle branch block.  The patient has had no evidence of shortness of breath weakness fatigue dizziness chest pain with physical activity in the last several weeks but learned of her son's suicide yesterday.  At that time the patient had significant chest discomfort to a significant degree substernal radiating into the back associated with shortness of breath.  EKG did not change but troponin level did increase to 0.18.  At this time she was placed on heparin and other medication management and had significant improvements of symptoms.  Currently the patient is hemodynamically stable with no further's issue  Past Medical History:  Diagnosis Date  . Hypertension   . Hypothyroidism   . Mitral regurgitation   . PVC's (premature ventricular contractions)   . Right bundle branch block   . Seasonal allergies       Surgical History:  Past Surgical History:  Procedure Laterality Date  . ABDOMINAL HYSTERECTOMY    . BREAST BIOPSY Left 04/27/2016   FIBROADENOMATOUS CHANGES WITH STROMAL HYALINIZATION AND   . CATARACT EXTRACTION    . Right elbow tendonitis    . ROTATOR CUFF REPAIR Bilateral      Home Meds: Prior to Admission medications   Medication Sig Start Date End Date Taking? Authorizing Provider  amLODipine (NORVASC) 10 MG tablet Take 10 mg by mouth daily. 06/01/18  Yes [provider]  aspirin EC 81 MG tablet  Take 81 mg by mouth daily.   Yes [provider]  levothyroxine (SYNTHROID, LEVOTHROID) 75 MCG tablet Take 75 mcg by mouth daily. 06/01/18  Yes [provider]    Inpatient Medications:  . amLODipine  10 mg Oral Daily  . [START ON 07/03/2018] aspirin  81 mg Oral Pre-Cath  . aspirin EC  325 mg Oral Daily  . atorvastatin  20 mg Oral q1800  . levothyroxine  75 mcg Oral Daily  . metoprolol tartrate  25 mg Oral BID  . nitroGLYCERIN  0.5 inch Topical Q6H  . pantoprazole  40 mg Oral Daily  . sodium chloride flush  3 mL Intravenous Once  . sodium chloride flush  3 mL Intravenous Q12H   . sodium chloride    . sodium chloride    . [START ON 07/03/2018] sodium chloride     Followed by  . [START ON 07/03/2018] sodium chloride    . heparin 700 Units/hr (07/02/18 0051)    Allergies: No Known Allergies  Social History   Socioeconomic History  . Marital status: Married    Spouse name: Not on file  . Number of children: Not on file  . Years of education: Not on file  . Highest education level: Not on file  Occupational History  . Not on file  Social Needs  . Financial resource strain: Not on file  . Food insecurity:    Worry: Not on file    Inability: Not on file  . Transportation needs:    Medical: Not on file  Non-medical: Not on file  Tobacco Use  . Smoking status: Not on file  Substance and Sexual Activity  . Alcohol use: Not on file  . Drug use: Never  . Sexual activity: Not on file  Lifestyle  . Physical activity:    Days per week: Not on file    Minutes per session: Not on file  . Stress: Not on file  Relationships  . Social connections:    Talks on phone: Not on file    Gets together: Not on file    Attends religious service: Not on file    Active member of club or organization: Not on file    Attends meetings of clubs or organizations: Not on file    Relationship status: Not on file  . Intimate partner violence:    Fear of current or ex partner:  Not on file    Emotionally abused: Not on file    Physically abused: Not on file    Forced sexual activity: Not on file  Other Topics Concern  . Not on file  Social History Narrative  . Not on file     Family History  Problem Relation Age of Onset  . Breast cancer Cousin   . CVA Mother   . Coronary artery disease Brother        Status post MI in late 54s     Review of Systems Positive for chest pain Negative for: General:  chills, fever, night sweats or weight changes.  Cardiovascular: PND orthopnea syncope dizziness  Dermatological skin lesions rashes Respiratory: Cough congestion Urologic: Frequent urination urination at night and hematuria Abdominal: negative for nausea, vomiting, diarrhea, bright red blood per rectum, melena, or hematemesis Neurologic: negative for visual changes, and/or hearing changes  All other systems reviewed and are otherwise negative except as noted above.  Labs: Recent Labs    07/01/18 2228 07/02/18 0348  TROPONINI 0.18* 3.36*   Lab Results  Component Value Date   WBC 6.0 07/01/2018   HGB 11.3 (L) 07/01/2018   HCT 34.3 (L) 07/01/2018   MCV 87.3 07/01/2018   PLT 166 07/01/2018    Recent Labs  Lab 07/01/18 2228 07/02/18 0348  NA 139  --   K 4.0  --   CL 104  --   CO2 27  --   BUN 30*  --   CREATININE 0.98  --   CALCIUM 9.5  --   PROT  --  6.5  BILITOT  --  0.3  ALKPHOS  --  47  ALT  --  20  AST  --  32  GLUCOSE 122*  --    No results found for: CHOL, HDL, LDLCALC, TRIG No results found for: DDIMER  Radiology/Studies:  Dg Chest 2 View  Result Date: 07/01/2018 CLINICAL DATA:  Substernal chest pain EXAM: CHEST - 2 VIEW COMPARISON:  01/12/2010 FINDINGS: There is hyperinflation of the lungs compatible with COPD. Scarring in the upper lobes and lung bases bilaterally. No acute confluent airspace opacities. Heart is borderline in size. No effusions or acute bony abnormality. IMPRESSION: COPD/chronic changes.  No active disease.  Electronically Signed   By: Charlett Nose M.D.   On: 07/01/2018 22:51    EKG: Normal sinus rhythm with right bundle branch block and frequent preventricular contractions  Weights: Filed Weights   07/01/18 2224  Weight: 59 kg     Physical Exam: Blood pressure 116/70, pulse 78, resp. rate 14, height 5' (1.524 m), weight 59 kg, SpO2  95 %. Body mass index is 25.39 kg/m. General: Well developed, well nourished, in no acute distress. Head eyes ears nose throat: Normocephalic, atraumatic, sclera non-icteric, no xanthomas, nares are without discharge. No apparent thyromegaly and/or mass  Lungs: Normal respiratory effort.  no wheezes, no rales, no rhonchi.  Heart: RRR with normal S1 S2. no murmur gallop, no rub, PMI is normal size and placement, carotid upstroke normal without bruit, jugular venous pressure is normal Abdomen: Soft, non-tender, non-distended with normoactive bowel sounds. No hepatomegaly. No rebound/guarding. No obvious abdominal masses. Abdominal aorta is normal size without bruit Extremities: No edema. no cyanosis, no clubbing, no ulcers  Peripheral : 2+ bilateral upper extremity pulses, 2+ bilateral femoral pulses, 2+ bilateral dorsal pedal pulse Neuro: Alert and oriented. No facial asymmetry. No focal deficit. Moves all extremities spontaneously. Musculoskeletal: Normal muscle tone without kyphosis Psych:  Responds to questions appropriately with a normal affect.    Assessment: 84 year old female with essential hypertension mixed hyperlipidemia not treated with an abnormal EKG chest pain and elevated troponin consistent with non-ST elevation myocardial infarction potentially with stress-induced cardiomyopathy versus coronary atherosclerosis  Plan: 1.  Continue heparin and aspirin for further risk reduction cardiovascular event 2.  Proceed to cardiac catheterization to assess coronary anatomy and further treatment thereof is necessary.  Patient understands the risk and  benefits of cardiac catheterization.  This includes the possibility of death stroke heart attack infection bleeding or blood clot.  She is at low risk for conscious sedation  Signed, Lamar Blinks M.D. [facility] Cardiology 07/02/2018, 7:24 AM

## 2018-07-02 NOTE — Progress Notes (Signed)
ANTICOAGULATION CONSULT NOTE - Initial Consult  Pharmacy Consult for heparin Indication: chest pain/ACS  No Known Allergies  Patient Measurements: Height: 5' (152.4 cm) Weight: 130 lb (59 kg) IBW/kg (Calculated) : 45.5 Heparin Dosing Weight: 51 kg  Vital Signs: BP: 136/60 (03/16 0000) Pulse Rate: 65 (03/16 0000)  Labs: Recent Labs    07/01/18 2228  HGB 11.3*  HCT 34.3*  PLT 166  APTT 38*  LABPROT 12.8  INR 1.0  CREATININE 0.98  TROPONINI 0.18*    Estimated Creatinine Clearance: 36.2 mL/min (by C-G formula based on SCr of 0.98 mg/dL).   Medical History: No past medical history on file.  Medications:  Scheduled:  . heparin  3,600 Units Intravenous Once  . sodium chloride flush  3 mL Intravenous Once    Assessment: Patient arrives to ED for CP after learning her son had passed by suicide. Patient's initial trop was 0.18, EKG pending, CXR shows COPD no acute process. Patient is not on any anticoagulants PTA. Baseline labs drawn, aPTT slightly elevated at 38 seconds, CBC/INR WNL. Patient is being started on heparin drip for possible NSTEMI  Goal of Therapy:  Heparin level 0.3-0.7 units/ml Monitor platelets by anticoagulation protocol: Yes   Plan:  Will bolus patient w/ heparin 3600 units IV x 1 Will start rate at 700 units/hr  Will check anti-Xa @ 0900 (8 hrs post start) Will monitor daily CBC's and adjust per anti-Xa levels.  Thomasene Ripple, PharmD, BCPS Clinical Pharmacist 07/02/2018

## 2018-07-02 NOTE — ED Notes (Signed)
Pt transported to cath lab by Denny Peon, RN and Wynona Canes, RN.

## 2018-07-02 NOTE — Progress Notes (Signed)
[facility] Cardiology [facility] Encounter Note  Patient: Michele [name] / Admit Date: 07/01/2018 / Date of Encounter: 07/02/2018, 8:34 AM    Subjective: Patient now has had full resolution of chest discomfort this a.m.  EKG unchanged showing frequent preventricular contractions and right bundle branch block. Cardiac catheterization shows apical ballooning with ejection fraction of 25 to 30% and normal coronary arteries  Review of Systems: Positive for: Shortness of breath Negative for: Vision change, hearing change, syncope, dizziness, nausea, vomiting,diarrhea, bloody stool, stomach pain, cough, congestion, diaphoresis, urinary frequency, urinary pain,skin lesions, skin rashes Others previously listed  Objective: Telemetry: Sinus rhythm with frequent preventricular contractions Physical Exam: Blood pressure 133/83, pulse 90, temperature 98.4 F (36.9 C), temperature source Oral, resp. rate 15, height 5' (1.524 m), weight 59 kg, SpO2 97 %. Body mass index is 25.39 kg/m. General: Well developed, well nourished, in no acute distress. Head: Normocephalic, atraumatic, sclera non-icteric, no xanthomas, nares are without discharge. Neck: No apparent masses Lungs: Normal respirations with no wheezes, no rhonchi, no rales , no crackles   Heart: Regular rate and rhythm, normal S1 S2, no murmur, no rub, no gallop, PMI is normal size and placement, carotid upstroke normal without bruit, jugular venous pressure normal Abdomen: Soft, non-tender, non-distended with normoactive bowel sounds. No hepatosplenomegaly. Abdominal aorta is normal size without bruit Extremities: No edema, no clubbing, no cyanosis, no ulcers,  Peripheral: 2+ radial, 2+ femoral, 2+ dorsal pedal pulses Neuro: Alert and oriented. Moves all extremities spontaneously. Psych:  Responds to questions appropriately with a normal affect.  No intake or output data in the 24 hours ending 07/02/18 0834  Inpatient Medications:  . [MAR  Hold] amLODipine  10 mg Oral Daily  . [START ON 07/03/2018] aspirin  81 mg Oral Pre-Cath  . [MAR Hold] aspirin EC  325 mg Oral Daily  . [MAR Hold] atorvastatin  20 mg Oral q1800  . [MAR Hold] levothyroxine  75 mcg Oral Daily  . [MAR Hold] metoprolol tartrate  25 mg Oral BID  . [MAR Hold] nitroGLYCERIN  0.5 inch Topical Q6H  . [MAR Hold] pantoprazole  40 mg Oral Daily  . [MAR Hold] sodium chloride flush  3 mL Intravenous Once  . sodium chloride flush  3 mL Intravenous Q12H   Infusions:  . sodium chloride 75 mL/hr at 07/02/18 0753  . sodium chloride    . [START ON 07/03/2018] sodium chloride     Followed by  . [START ON 07/03/2018] sodium chloride    . heparin Stopped (07/02/18 0740)    Labs: Recent Labs    06/30/18 2228 07/01/18 2228 07/02/18 0348  NA  --  139  --   K  --  4.0  --   CL  --  104  --   CO2  --  27  --   GLUCOSE  --  122*  --   BUN  --  30*  --   CREATININE  --  0.98  --   CALCIUM  --  9.5  --   MG 2.2  --   --   PHOS  --   --  3.2   Recent Labs    07/02/18 0348  AST 32  ALT 20  ALKPHOS 47  BILITOT 0.3  PROT 6.5  ALBUMIN 4.2   Recent Labs    07/01/18 2228 07/02/18 0708  WBC 6.0 7.3  HGB 11.3* 10.4*  HCT 34.3* 31.5*  MCV 87.3 86.8  PLT 166 159   Recent  Labs    07/01/18 2228 07/02/18 0348  TROPONINI 0.18* 3.36*   Invalid input(s): POCBNP No results for input(s): HGBA1C in the last 72 hours.   Weights: Filed Weights   07/01/18 2224  Weight: 59 kg     Radiology/Studies:  Dg Chest 2 View  Result Date: 07/01/2018 CLINICAL DATA:  Substernal chest pain EXAM: CHEST - 2 VIEW COMPARISON:  01/12/2010 FINDINGS: There is hyperinflation of the lungs compatible with COPD. Scarring in the upper lobes and lung bases bilaterally. No acute confluent airspace opacities. Heart is borderline in size. No effusions or acute bony abnormality. IMPRESSION: COPD/chronic changes.  No active disease. Electronically Signed   By: Charlett Nose M.D.   On: 07/01/2018  22:51     Assessment and Recommendation  84 y.o. female with essential hypertension having significant stress and anxiety causing stress-induced cardiomyopathy and/or apical ballooning syndrome with normal coronary arteries now improving 1.  Metoprolol for LV systolic dysfunction and apical ballooning syndrome with stress-induced cardiomyopathy 2.  Consider changing amlodipine to ACE inhibitor for LV systolic dysfunction and non-ST elevation myocardial infarction 3.  No additional diuretics at this time due to no evidence of heart failure type symptoms 4.  Ambulation and follow for improvements of symptoms with adjustments of medications 5.  Further treatment options after above  Signed, Arnoldo Hooker M.D. FACC

## 2018-07-02 NOTE — ED Notes (Signed)
Trop 3.36 Provider made aware.

## 2018-07-02 NOTE — Progress Notes (Signed)
Patient ID: NYSA BROSZ, female   DOB: 1935-02-08, 84 y.o.   MRN: [id number]  [employer] PROGRESS NOTE  Michele [name] WGN:[id number] DOB: 1935-03-15 DOA: 07/01/2018 PCP: Marguarite Arbour, MD  HPI/Subjective: Patient came in with chest pain.  She just learned that her 16 year old son may have committed suicide.  In the ER her troponin went up to 3.36.  I saw her while she was in the cardiac cath recovery area.  Objective: Vitals:   07/02/18 1400 07/02/18 1430  BP: 124/67 125/76  Pulse:    Resp: 16 16  Temp:    SpO2: 95% 95%    Filed Weights   07/01/18 2224  Weight: 59 kg    ROS: Review of Systems  Constitutional: Negative for chills and fever.  Eyes: Negative for blurred vision.  Respiratory: Positive for shortness of breath. Negative for cough.   Cardiovascular: Negative for chest pain.  Gastrointestinal: Negative for abdominal pain, constipation, diarrhea, nausea and vomiting.  Genitourinary: Negative for dysuria.  Musculoskeletal: Negative for joint pain.  Neurological: Negative for dizziness and headaches.   Exam: Physical Exam  Constitutional: She is oriented to person, place, and time.  HENT:  Nose: No mucosal edema.  Mouth/Throat: No oropharyngeal exudate or posterior oropharyngeal edema.  Eyes: Pupils are equal, round, and reactive to light. Conjunctivae, EOM and lids are normal.  Neck: No JVD present. Carotid bruit is not present. No edema present. No thyroid mass and no thyromegaly present.  Cardiovascular: S1 normal and S2 normal. Exam reveals no gallop.  No murmur heard. Pulses:      Dorsalis pedis pulses are 2+ on the right side and 2+ on the left side.  Respiratory: No respiratory distress. She has no wheezes. She has no rhonchi. She has no rales.  GI: Soft. Bowel sounds are normal. There is no abdominal tenderness.  Musculoskeletal:     Right ankle: She exhibits no swelling.     Left ankle: She exhibits no swelling.  Lymphadenopathy:    She has  no cervical adenopathy.  Neurological: She is alert and oriented to person, place, and time. No cranial nerve deficit.  Skin: Skin is warm. No rash noted. Nails show no clubbing.  Psychiatric: She has a normal mood and affect.      Data Reviewed: Basic Metabolic Panel: Recent Labs  Lab 06/30/18 2228 07/01/18 2228 07/02/18 0348  NA  --  139  --   K  --  4.0  --   CL  --  104  --   CO2  --  27  --   GLUCOSE  --  122*  --   BUN  --  30*  --   CREATININE  --  0.98  --   CALCIUM  --  9.5  --   MG 2.2  --   --   PHOS  --   --  3.2   Liver Function Tests: Recent Labs  Lab 07/02/18 0348  AST 32  ALT 20  ALKPHOS 47  BILITOT 0.3  PROT 6.5  ALBUMIN 4.2   CBC: Recent Labs  Lab 07/01/18 2228 07/02/18 0708  WBC 6.0 7.3  HGB 11.3* 10.4*  HCT 34.3* 31.5*  MCV 87.3 86.8  PLT 166 159   Cardiac Enzymes: Recent Labs  Lab 07/01/18 2228 07/02/18 0348 07/02/18 0708  TROPONINI 0.18* 3.36* 3.15*   BNP (last 3 results) Recent Labs    07/01/18 2228  BNP 109.0*      Studies: Dg  Chest 2 View  Result Date: 07/01/2018 CLINICAL DATA:  Substernal chest pain EXAM: CHEST - 2 VIEW COMPARISON:  01/12/2010 FINDINGS: There is hyperinflation of the lungs compatible with COPD. Scarring in the upper lobes and lung bases bilaterally. No acute confluent airspace opacities. Heart is borderline in size. No effusions or acute bony abnormality. IMPRESSION: COPD/chronic changes.  No active disease. Electronically Signed   By: Charlett Nose M.D.   On: 07/01/2018 22:51    Scheduled Meds: . [START ON 07/03/2018] aspirin  81 mg Oral Pre-Cath  . [MAR Hold] aspirin EC  325 mg Oral Daily  . [MAR Hold] atorvastatin  20 mg Oral q1800  . [START ON 07/03/2018] furosemide  20 mg Oral Daily  . [MAR Hold] levothyroxine  75 mcg Oral Daily  . lisinopril  5 mg Oral Daily  . [MAR Hold] metoprolol tartrate  25 mg Oral BID  . [MAR Hold] nitroGLYCERIN  0.5 inch Topical Q6H  . [MAR Hold] pantoprazole  40 mg  Oral Daily  . [MAR Hold] sodium chloride flush  3 mL Intravenous Once  . sodium chloride flush  3 mL Intravenous Q12H   Continuous Infusions: . sodium chloride Stopped (07/02/18 1135)  . sodium chloride    . [START ON 07/03/2018] sodium chloride     Followed by  . [START ON 07/03/2018] sodium chloride    . sodium chloride 1 mL/kg/hr (07/02/18 1135)    Assessment/Plan:  1. NSTEMI with elevated troponin secondary to stress-induced cardiomyopathy.  Patient had horrible news that her son had died by possible suicide.  Cardiac catheterization showed normal coronary arteries with a reduced ejection fraction.  Patient is on aspirin, metoprolol.  Add lisinopril.  Patient started also on atorvastatin. 2. Hypertension.  Discontinue Norvasc and start lisinopril instead.  Continue metoprolol 3. Hypothyroidism unspecified on levothyroxine  Code Status:     Code Status Orders  (From admission, onward)         Start     Ordered   07/02/18 0127  Full code  Continuous     07/02/18 0139        Code Status History    This patient has a current code status but no historical code status.     Family Communication: Spoke with husband at the bedside Disposition Plan: To be determined  Consultants:  cardiology  Time spent: 35 minutes  Jemia Fata [employer]

## 2018-07-02 NOTE — ED Notes (Addendum)
2nd EKG done at 0625 and given to Dr. Arville Care

## 2018-07-02 NOTE — Progress Notes (Signed)
Order clarification for nitroglycerin ointment from MD. Per Dr. Renae Gloss, discontinue this medication and continue lisinopril and metoprolol. Will continue to monitor patient. Blood pressure stable and no complaints of chest pain.

## 2018-07-02 NOTE — ED Notes (Signed)
Pt stated that her chest was hurting and asked if she could have some pain medication.

## 2018-07-03 ENCOUNTER — Inpatient Hospital Stay
Admit: 2018-07-03 | Discharge: 2018-07-03 | Disposition: A | Discharge status: Home / Self Care | Payer: Medicare HMO | Attending: Family Medicine | Admitting: Family Medicine

## 2018-07-03 ENCOUNTER — Encounter: Payer: Self-pay | Admitting: Internal Medicine

## 2018-07-03 DIAGNOSIS — R079 Chest pain, unspecified: Secondary | ICD-10-CM

## 2018-07-03 DIAGNOSIS — R7989 Other specified abnormal findings of blood chemistry: Secondary | ICD-10-CM

## 2018-07-03 LAB — CBC
HCT: 32.8 % — ABNORMAL LOW (ref 36.0–46.0)
Hemoglobin: 10.9 g/dL — ABNORMAL LOW (ref 12.0–15.0)
MCH: 28.8 pg (ref 26.0–34.0)
MCHC: 33.2 g/dL (ref 30.0–36.0)
MCV: 86.8 fL (ref 80.0–100.0)
Platelets: 146 10*3/uL — ABNORMAL LOW (ref 150–400)
RBC: 3.78 MIL/uL — ABNORMAL LOW (ref 3.87–5.11)
RDW: 13.8 % (ref 11.5–15.5)
WBC: 5.9 10*3/uL (ref 4.0–10.5)
nRBC: 0 % (ref 0.0–0.2)

## 2018-07-03 LAB — ECHOCARDIOGRAM COMPLETE
Height: 65 in
Weight: 2065.6 oz

## 2018-07-03 LAB — BASIC METABOLIC PANEL
Anion gap: 8 (ref 5–15)
BUN: 21 mg/dL (ref 8–23)
CO2: 26 mmol/L (ref 22–32)
CREATININE: 0.88 mg/dL (ref 0.44–1.00)
Calcium: 9.1 mg/dL (ref 8.9–10.3)
Chloride: 108 mmol/L (ref 98–111)
GFR calc Af Amer: >60 mL/min (ref >60)
GFR calc non Af Amer: >60 mL/min (ref >60)
Glucose, Bld: 103 mg/dL — ABNORMAL HIGH (ref 70–99)
Potassium: 3.5 mmol/L (ref 3.5–5.1)
Sodium: 142 mmol/L (ref 135–145)

## 2018-07-03 MED ORDER — ALPRAZOLAM 0.25 MG PO TABS: 0.2500 mg | ORAL_TABLET | Freq: Two times a day (BID) | ORAL | 0 refills | Status: AC | PRN

## 2018-07-03 MED ORDER — METOPROLOL TARTRATE 25 MG PO TABS: 25.0000 mg | ORAL_TABLET | Freq: Two times a day (BID) | ORAL | 0 refills | Status: AC

## 2018-07-03 MED ORDER — LISINOPRIL 5 MG PO TABS: 5.0000 mg | ORAL_TABLET | Freq: Every day | ORAL | 0 refills | Status: AC

## 2018-07-03 NOTE — Progress Notes (Signed)
[facility] Encounter Note  Patient: Michele Michele [name] / Date of Encounter: 07/03/2018, 8:19 AM    Subjective: Patient now has had full resolution of chest discomfort this a.m. patient has had no further symptoms after resolution and cardiac catheterization yesterday.  Patient tolerating medication management for apical ballooning and or stress-induced cardiomyopathy.  EKG unchanged showing frequent preventricular contractions and right bundle branch block. Cardiac catheterization shows apical ballooning with ejection fraction of 25 to 30% and normal coronary arteries  Review of Systems: Positive for: None Negative for: Vision change, hearing change, syncope, dizziness, nausea, vomiting,diarrhea, bloody stool, stomach pain, cough, congestion, diaphoresis, urinary frequency, urinary pain,skin lesions, skin rashes Others previously listed  Objective: Telemetry: Sinus rhythm with frequent preventricular contractions Physical Exam: Blood pressure (!) 109/92, pulse 84, temperature 98.1 F (36.7 C), temperature source Oral, resp. rate 20, height 5\' 5"  (1.651 m), weight 58.6 kg, SpO2 99 %. Body mass index is 21.48 kg/m. General: Well developed, well nourished, in no acute distress. Head: Normocephalic, atraumatic, sclera non-icteric, no xanthomas, nares are without discharge. Neck: No apparent masses Lungs: Normal respirations with no wheezes, no rhonchi, no rales , no crackles   Heart: Regular rate and rhythm, normal S1 S2, no murmur, no rub, no gallop, PMI is normal size and placement, carotid upstroke normal without bruit, jugular venous pressure normal Abdomen: Soft, non-tender, non-distended with normoactive bowel sounds. No hepatosplenomegaly. Abdominal aorta is normal size without bruit Extremities: No edema, no clubbing, no cyanosis, no ulcers,  Peripheral: 2+ radial, 2+ femoral, 2+ dorsal pedal pulses Neuro: Alert and oriented. Moves all  extremities spontaneously. Psych:  Responds to questions appropriately with a normal affect.   Intake/Output Summary (Last 24 hours) at 07/03/2018 0819 Last data filed at 07/03/2018 0111 Gross per 24 hour  Intake 277.5 ml  Output 1400 ml  Net -1122.5 ml    Inpatient Medications:  . aspirin EC  325 mg Oral Daily  . atorvastatin  20 mg Oral q1800  . furosemide  20 mg Oral Daily  . levothyroxine  75 mcg Oral Daily  . lisinopril  5 mg Oral Daily  . metoprolol tartrate  25 mg Oral BID  . pantoprazole  40 mg Oral Daily  . pneumococcal 23 valent vaccine  0.5 mL Intramuscular Tomorrow-1000  . sodium chloride flush  3 mL Intravenous Once   Infusions:    Labs: Recent Labs    06/30/18 2228 07/01/18 2228 07/02/18 0348 07/03/18 0505  NA  --  139  --  142  K  --  4.0  --  3.5  CL  --  104  --  108  CO2  --  27  --  26  GLUCOSE  --  122*  --  103*  BUN  --  30*  --  21  CREATININE  --  0.98  --  0.88  CALCIUM  --  9.5  --  9.1  MG 2.2  --   --   --   PHOS  --   --  3.2  --    Recent Labs    07/02/18 0348  AST 32  ALT 20  ALKPHOS 47  BILITOT 0.3  PROT 6.5  ALBUMIN 4.2   Recent Labs    07/02/18 0708 07/03/18 0505  WBC 7.3 5.9  HGB 10.4* 10.9*  HCT 31.5* 32.8*  MCV 86.8 86.8  PLT 159 146*   Recent Labs    07/01/18 2228 07/02/18 0348  07/02/18 0708  TROPONINI 0.18* 3.36* 3.15*   Invalid input(s): POCBNP No results for input(s): HGBA1C in the last 72 hours.   Weights: Filed Weights   07/01/18 2224 07/02/18 1556  Weight: 59 kg 58.6 kg     Radiology/Studies:  Dg Chest 2 View  Result Date: Michele [name] CLINICAL DATA:  Substernal chest pain EXAM: CHEST - 2 VIEW COMPARISON:  01/12/2010 FINDINGS: There is hyperinflation of the lungs compatible with COPD. Scarring in the upper lobes and lung bases bilaterally. No acute confluent airspace opacities. Heart is borderline in size. No effusions or acute bony abnormality. IMPRESSION: COPD/chronic changes.  No active  disease. Electronically Signed   By: Charlett Nose M.D.   On: 07/01/2018 22:51     Assessment and Recommendation  84 y.o. female with essential hypertension having significant stress and anxiety causing stress-induced cardiomyopathy and/or apical ballooning syndrome with normal coronary arteries now improving 1.  Metoprolol and lisinopril for LV systolic dysfunction and apical ballooning syndrome with stress-induced cardiomyopathy 2.  No further cardiac intervention and or diagnostics necessary at this time 3.  No additional diuretics at this time due to no evidence of heart failure type symptoms this a.m. 4.  Begin ambulation and follow for improvements of symptoms with medication management as above 5.  Okay for discharge home from cardiac standpoint with follow-up in 1 to 2 weeks for further adjustments of medication  Signed, Arnoldo Hooker M.D. FACC

## 2018-07-03 NOTE — Progress Notes (Addendum)
Cardiovascular and Pulmonary Nurse Navigator Note:    84 year old female with hx of HTN, hypothyroidism, PVCs, and seasonal allergies, mitral regurgitation who presented to the ED with chest pain.  Prior to this chest pain event patient had learned that her 46 year old son who was battling depression unfortunately committed suicide.  Initial troponin was 0.18; ECG right BBB and PVCs, sinus tach at 103.    Patient underwent Cardiac Cath on 07/02/2018, which revealed:    LEFT HEART CATH AND CORONARY ANGIOGRAPHY  Conclusion   84 year old female with hypertension and borderline hyperlipidemia having a significant stress with severe chest discomfort and elevated troponin consistent with non-ST elevation myocardial infarction and abnormal EKG with preventricular contractions and right bundle branch block  Left ventricle showing apical ballooning syndrome and/or stress-induced cardiomyopathy  Normal coronary arteries without evidence of atherosclerosis and with 0% stenosis  Assessment Apical ballooning and or stress-induced cardiomyopathy with normal coronary arteries  Plan Medication management for LV systolic dysfunction including beta-blocker ACE inhibitor Cardiac rehabilitation No further cardiac diagnostics necessary at this time Ambulation and follow for improvements of symptoms    Patient dressed and ready for discharge.  Husband at bedside.  Rounded on patient to discuss Cardiac Rehab referral.  Patient tearful and shared with this RN that she learned her son had passed and that's what brought her to the hospital.  Patient stated, "I cannot think about Cardiac Rehab at this time."  Patient in agreement for Cardiac Rehab dept to contact her in approximately one month to discuss Cardiac Rehab further.  This RN extended her condolences to patient and her husband over their loss.  No further education provided, as patient was not ready to receive information.     Army Melia, RN, BSN,  [facility]  [place]  [facility] Cardiac & Pulmonary Rehab  Cardiovascular & Pulmonary Nurse Navigator  Direct Line: [phone]  Department Phone #: [phone] Fax: [phone]  Email Address: [email or web address]

## 2018-07-03 NOTE — Progress Notes (Signed)
Pt ambulated around nursing station / tolerated well/ no c/o pain or sob. Discharge instructions explained to pt/ verbalized an understanding/ iv and tele removed/ will transport off unit via wheelchair.

## 2018-07-03 NOTE — Discharge Summary (Signed)
[facility] - [place] at [facility]   PATIENT NAME: Michele [name]    MR#:  [id number]  DATE OF BIRTH:  1935/03/11  DATE OF ADMISSION:  07/01/2018 ADMITTING PHYSICIAN: Hannah Beat, MD  DATE OF DISCHARGE: 07/02/2018  PRIMARY CARE PHYSICIAN: Marguarite Arbour, MD    ADMISSION DIAGNOSIS:  Precordial pain [R07.2] NSTEMI (non-ST elevated myocardial infarction) (HCC) [I21.4]  DISCHARGE DIAGNOSIS:  Stress-induced cardiomyopathy (takosubo cardiomyopathy)  SECONDARY DIAGNOSIS:   Past Medical History:  Diagnosis Date  . Hypertension   . Hypothyroidism   . Mitral regurgitation   . PVC's (premature ventricular contractions)   . Right bundle branch block   . Seasonal allergies     HOSPITAL COURSE:   1.  Stress-induced cardiomyopathy (Takosubu).  Patient was admitted with chest pain and second troponin elevation.  She was brought to the cardiac Cath Lab where she had normal coronaries and EF was low from the cardiac cath.  Patient was started on metoprolol and lisinopril.  Given a dose of Lasix.  Echocardiograms ejection fraction the following day showed a better ejection fraction.  Side effects of metoprolol and lisinopril explained to the patient.  The elevation of troponin is demand ischemia.  Patient already takes aspirin at home. 2.  Hypertension.  Discontinue Norvasc and start lisinopril and metoprolol 3.  Hypothyroidism unspecified on levothyroxine 4.  Grief reaction.  Patient did not sleep last night.  Patient received news that her son died possibly of suicide prior to being admitted to the hospital.  I did prescribe only a few pills of Xanax only if needed.  Patient likely will not take it.   DISCHARGE CONDITIONS:  Satisfactory  CONSULTS OBTAINED:  None  DRUG ALLERGIES:  No Known Allergies  DISCHARGE MEDICATIONS:   Allergies as of 07/03/2018   No Known Allergies     Medication List    STOP taking these medications   amLODipine 10 MG tablet Commonly  known as:  NORVASC     TAKE these medications   ALPRAZolam 0.25 MG tablet Commonly known as:  XANAX Take 1 tablet (0.25 mg total) by mouth 2 (two) times daily as needed for anxiety.   aspirin EC 81 MG tablet Take 81 mg by mouth daily.   levothyroxine 75 MCG tablet Commonly known as:  SYNTHROID, LEVOTHROID Take 75 mcg by mouth daily.   lisinopril 5 MG tablet Commonly known as:  PRINIVIL,ZESTRIL Take 1 tablet (5 mg total) by mouth daily.   metoprolol tartrate 25 MG tablet Commonly known as:  LOPRESSOR Take 1 tablet (25 mg total) by mouth 2 (two) times daily.        DISCHARGE INSTRUCTIONS:   Follow-up PMD 5 days Cardiology 1 to 2 weeks  If you experience worsening of your admission symptoms, develop shortness of breath, life threatening emergency, suicidal or homicidal thoughts you must seek medical attention immediately by calling 911 or calling your MD immediately  if symptoms less severe.  You Must read complete instructions/literature along with all the possible adverse reactions/side effects for all the Medicines you take and that have been prescribed to you. Take any new Medicines after you have completely understood and accept all the possible adverse reactions/side effects.   Please note  You were cared for by a hospitalist during your hospital stay. If you have any questions about your discharge medications or the care you received while you were in the hospital after you are discharged, you can call the unit and asked to speak with  the hospitalist on call if the hospitalist that took care of you is not available. Once you are discharged, your primary care physician will handle any further medical issues. Please note that NO REFILLS for any discharge medications will be authorized once you are discharged, as it is imperative that you return to your primary care physician (or establish a relationship with a primary care physician if you do not have one) for your aftercare  needs so that they can reassess your need for medications and monitor your lab values.    Today   CHIEF COMPLAINT:   Chief Complaint  Patient presents with  . Chest Pain    HISTORY OF PRESENT ILLNESS:  Michele [name]  is a 84 y.o. female presented with chest pain   VITAL SIGNS:  Blood pressure (!) 109/92, pulse 84, temperature 98.1 F (36.7 C), temperature source Oral, resp. rate 20, height 5\' 5"  (1.651 m), weight 58.6 kg, SpO2 99 %.    PHYSICAL EXAMINATION:  GENERAL:  84 y.o.-year-old patient lying in the bed with no acute distress.  EYES: Pupils equal, round, reactive to light and accommodation. No scleral icterus. Extraocular muscles intact.  HEENT: Head atraumatic, normocephalic. Oropharynx and nasopharynx clear.  NECK:  Supple, no jugular venous distention. No thyroid enlargement, no tenderness.  LUNGS: Normal breath sounds bilaterally, no wheezing, rales,rhonchi or crepitation. No use of accessory muscles of respiration.  CARDIOVASCULAR: S1, S2 normal. No murmurs, rubs, or gallops.  ABDOMEN: Soft, non-tender, non-distended. Bowel sounds present. No organomegaly or mass.  EXTREMITIES: No pedal edema, cyanosis, or clubbing.  NEUROLOGIC: Cranial nerves II through XII are intact. Muscle strength 5/5 in all extremities. Sensation intact. Gait not checked.  PSYCHIATRIC: The patient is alert and oriented x 3.  SKIN: No obvious rash, lesion, or ulcer.   DATA REVIEW:   CBC Recent Labs  Lab 07/03/18 0505  WBC 5.9  HGB 10.9*  HCT 32.8*  PLT 146*    Chemistries  Recent Labs  Lab 06/30/18 2228  07/02/18 0348 07/03/18 0505  NA  --    < >  --  142  K  --    < >  --  3.5  CL  --    < >  --  108  CO2  --    < >  --  26  GLUCOSE  --    < >  --  103*  BUN  --    < >  --  21  CREATININE  --    < >  --  0.88  CALCIUM  --    < >  --  9.1  MG 2.2  --   --   --   AST  --   --  32  --   ALT  --   --  20  --   ALKPHOS  --   --  47  --   BILITOT  --   --  0.3  --    < > =  values in this interval not displayed.    Cardiac Enzymes Recent Labs  Lab 07/02/18 0708  TROPONINI 3.15*      RADIOLOGY:  Dg Chest 2 View  Result Date: 07/01/2018 CLINICAL DATA:  Substernal chest pain EXAM: CHEST - 2 VIEW COMPARISON:  01/12/2010 FINDINGS: There is hyperinflation of the lungs compatible with COPD. Scarring in the upper lobes and lung bases bilaterally. No acute confluent airspace opacities. Heart is borderline in size. No effusions or acute  bony abnormality. IMPRESSION: COPD/chronic changes.  No active disease. Electronically Signed   By: Charlett Nose M.D.   On: 07/01/2018 22:51     Management plans discussed with the family and she is in agreement.  CODE STATUS:     Code Status Orders  (From admission, onward)         Start     Ordered   07/02/18 0127  Full code  Continuous     07/02/18 0139        Code Status History    This patient has a current code status but no historical code status.      TOTAL TIME TAKING CARE OF THIS PATIENT: 35 minutes.    Alford Highland M.D on 07/03/2018 at 1:53 PM  Between 7am to 6pm - Pager - [phone]  After 6pm go to [email or web address] - password [email or web address]  [facility] Office  [phone]  CC: Primary care physician; Marguarite Arbour, MD

## 2018-07-03 NOTE — Progress Notes (Signed)
*  PRELIMINARY RESULTS* Echocardiogram 2D Echocardiogram has been performed.  Michele [name] 07/03/2018, 9:23 AM

## 2018-07-12 ENCOUNTER — Other Ambulatory Visit: Payer: Self-pay

## 2018-07-12 ENCOUNTER — Other Ambulatory Visit: Payer: Self-pay | Admitting: Internal Medicine

## 2018-07-12 ENCOUNTER — Other Ambulatory Visit (HOSPITAL_COMMUNITY): Payer: Self-pay | Admitting: Internal Medicine

## 2018-07-12 ENCOUNTER — Ambulatory Visit
Admission: RE | Admit: 2018-07-12 | Discharge: 2018-07-12 | Disposition: A | Discharge status: Home / Self Care | Payer: Medicare HMO | Source: Ambulatory Visit | Attending: Internal Medicine | Admitting: Internal Medicine

## 2018-07-12 DIAGNOSIS — R0602 Shortness of breath: Secondary | ICD-10-CM | POA: Diagnosis present

## 2018-07-12 DIAGNOSIS — R0789 Other chest pain: Secondary | ICD-10-CM | POA: Insufficient documentation

## 2018-07-12 MED ORDER — IOPAMIDOL (ISOVUE-370) INJECTION 76%
75.0000 mL | Freq: Once | INTRAVENOUS | Status: AC | PRN
  Administered 2018-07-12: 75 mL via INTRAVENOUS

## 2019-02-01 ENCOUNTER — Other Ambulatory Visit: Payer: Self-pay | Admitting: Internal Medicine

## 2019-02-01 DIAGNOSIS — R441 Visual hallucinations: Secondary | ICD-10-CM

## 2019-02-07 ENCOUNTER — Ambulatory Visit
Admission: RE | Admit: 2019-02-07 | Discharge: 2019-02-07 | Disposition: A | Discharge status: Home / Self Care | Payer: Medicare HMO | Source: Ambulatory Visit | Attending: Internal Medicine | Admitting: Internal Medicine

## 2019-02-07 ENCOUNTER — Other Ambulatory Visit: Payer: Self-pay

## 2019-02-07 DIAGNOSIS — R441 Visual hallucinations: Secondary | ICD-10-CM | POA: Insufficient documentation

## 2019-02-07 DIAGNOSIS — G319 Degenerative disease of nervous system, unspecified: Secondary | ICD-10-CM | POA: Diagnosis not present

## 2019-03-05 ENCOUNTER — Other Ambulatory Visit: Payer: Self-pay | Admitting: Gastroenterology

## 2019-03-05 ENCOUNTER — Other Ambulatory Visit (HOSPITAL_COMMUNITY): Payer: Self-pay | Admitting: Gastroenterology

## 2019-03-05 DIAGNOSIS — R1013 Epigastric pain: Secondary | ICD-10-CM

## 2019-03-05 DIAGNOSIS — R1313 Dysphagia, pharyngeal phase: Secondary | ICD-10-CM

## 2019-03-08 ENCOUNTER — Other Ambulatory Visit: Payer: Self-pay

## 2019-03-08 ENCOUNTER — Other Ambulatory Visit: Payer: Self-pay | Admitting: Gastroenterology

## 2019-03-08 ENCOUNTER — Ambulatory Visit
Admission: RE | Admit: 2019-03-08 | Discharge: 2019-03-08 | Disposition: A | Discharge status: Home / Self Care | Payer: Medicare HMO | Source: Ambulatory Visit | Attending: Gastroenterology | Admitting: Gastroenterology

## 2019-03-08 DIAGNOSIS — R1313 Dysphagia, pharyngeal phase: Secondary | ICD-10-CM | POA: Insufficient documentation

## 2019-03-08 DIAGNOSIS — R1013 Epigastric pain: Secondary | ICD-10-CM | POA: Diagnosis not present

## 2019-04-24 ENCOUNTER — Other Ambulatory Visit: Payer: Self-pay | Admitting: Internal Medicine

## 2019-04-24 DIAGNOSIS — Z1231 Encounter for screening mammogram for malignant neoplasm of breast: Secondary | ICD-10-CM

## 2019-06-05 ENCOUNTER — Ambulatory Visit
Admission: RE | Admit: 2019-06-05 | Discharge: 2019-06-05 | Disposition: A | Discharge status: Home / Self Care | Payer: Medicare HMO | Source: Ambulatory Visit | Attending: Internal Medicine | Admitting: Internal Medicine

## 2019-06-05 DIAGNOSIS — Z1231 Encounter for screening mammogram for malignant neoplasm of breast: Secondary | ICD-10-CM | POA: Insufficient documentation

## 2020-06-09 ENCOUNTER — Other Ambulatory Visit: Payer: Self-pay | Admitting: Internal Medicine

## 2020-06-09 DIAGNOSIS — Z1231 Encounter for screening mammogram for malignant neoplasm of breast: Secondary | ICD-10-CM

## 2020-07-14 ENCOUNTER — Ambulatory Visit
Admission: RE | Admit: 2020-07-14 | Discharge: 2020-07-14 | Disposition: A | Discharge status: Home / Self Care | Payer: Medicare HMO | Source: Ambulatory Visit | Attending: Internal Medicine | Admitting: Internal Medicine

## 2020-07-14 ENCOUNTER — Other Ambulatory Visit: Payer: Self-pay

## 2020-07-14 DIAGNOSIS — Z1231 Encounter for screening mammogram for malignant neoplasm of breast: Secondary | ICD-10-CM | POA: Insufficient documentation

## 2020-11-11 IMAGING — MG DIGITAL SCREENING BILATERAL MAMMOGRAM WITH TOMO AND CAD
8 series · 9 of 24 positions shown · non-contrast
Comparison: Previous exam(s).

CLINICAL DATA: Screening.

EXAM:
DIGITAL SCREENING BILATERAL MAMMOGRAM WITH TOMO AND CAD

[R MLO synth-2D]
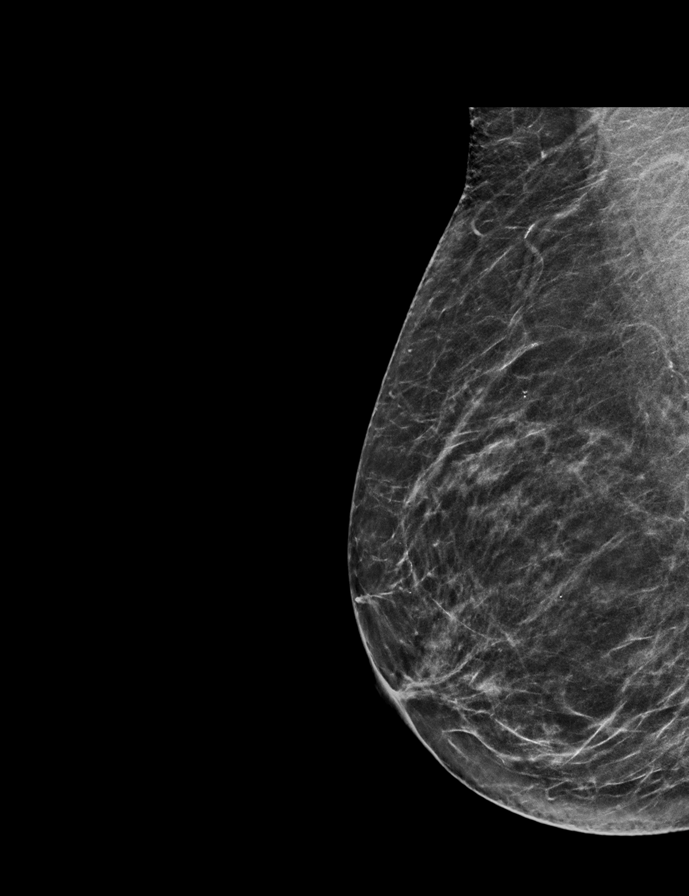

[R CC synth-2D]
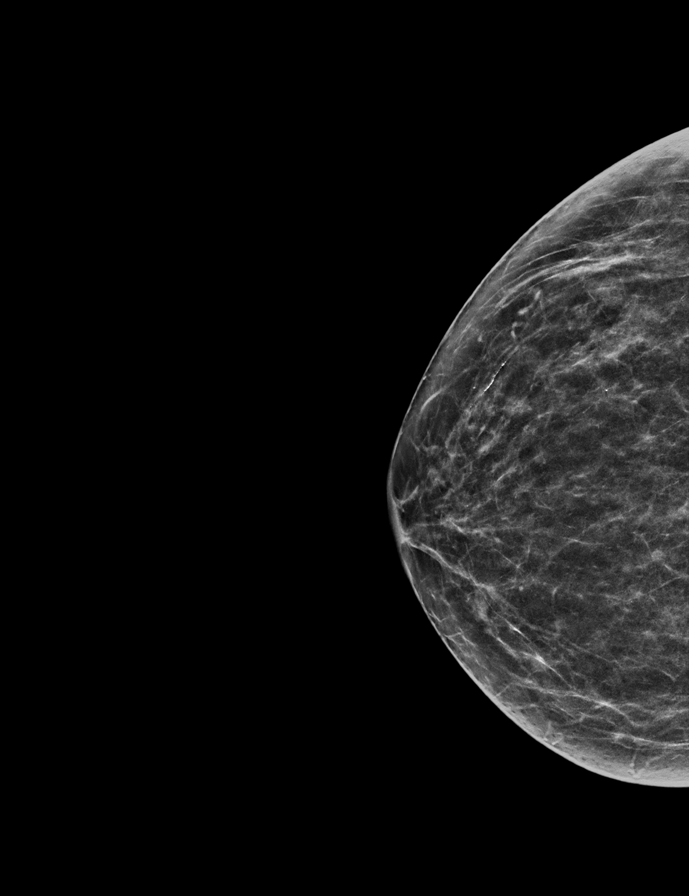

[L MLO synth-2D]
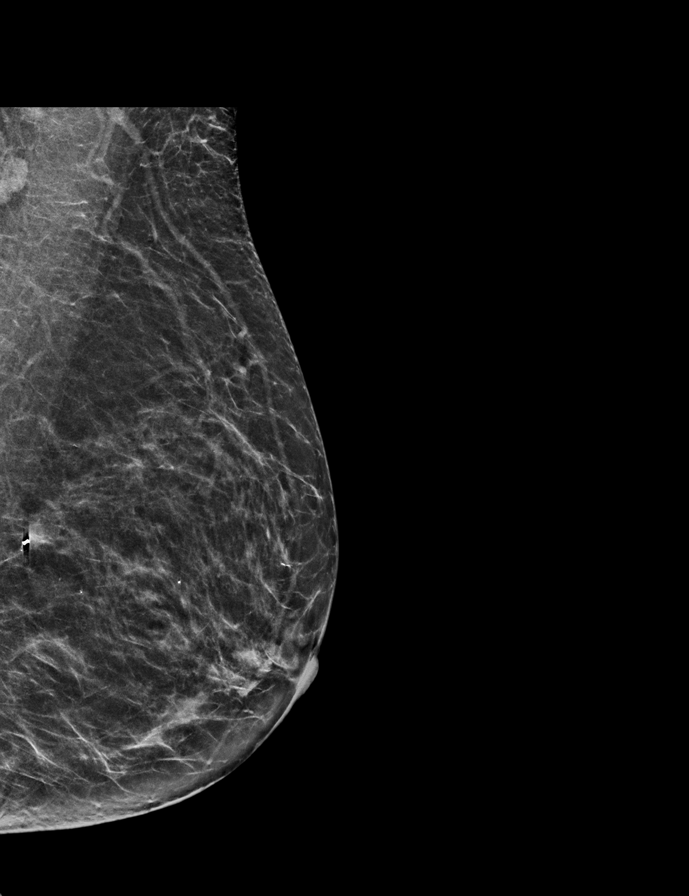

[L CC synth-2D]
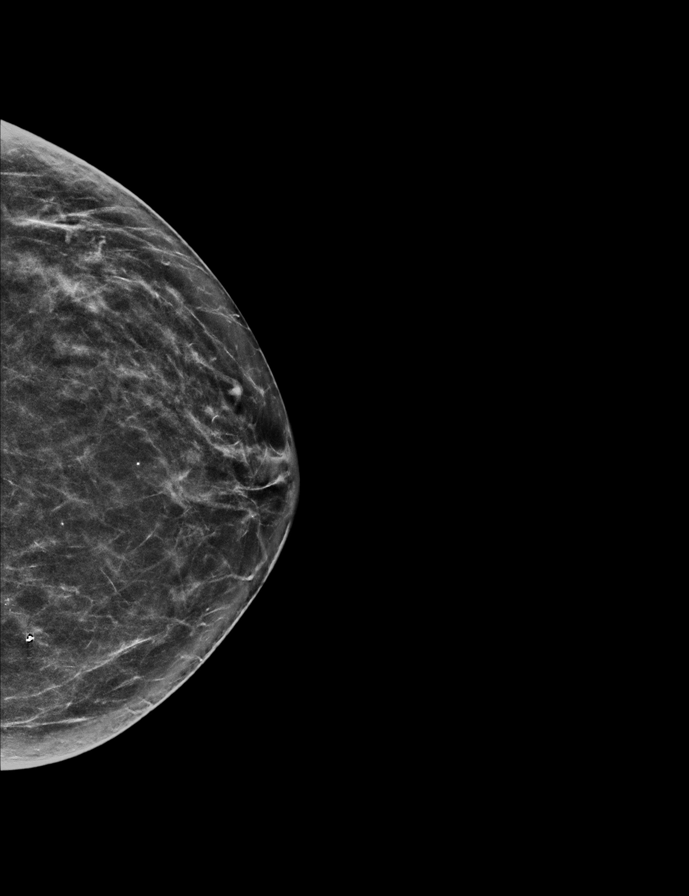

[R CC tomo · 2 of 54 frames shown]
[frame 18/54]
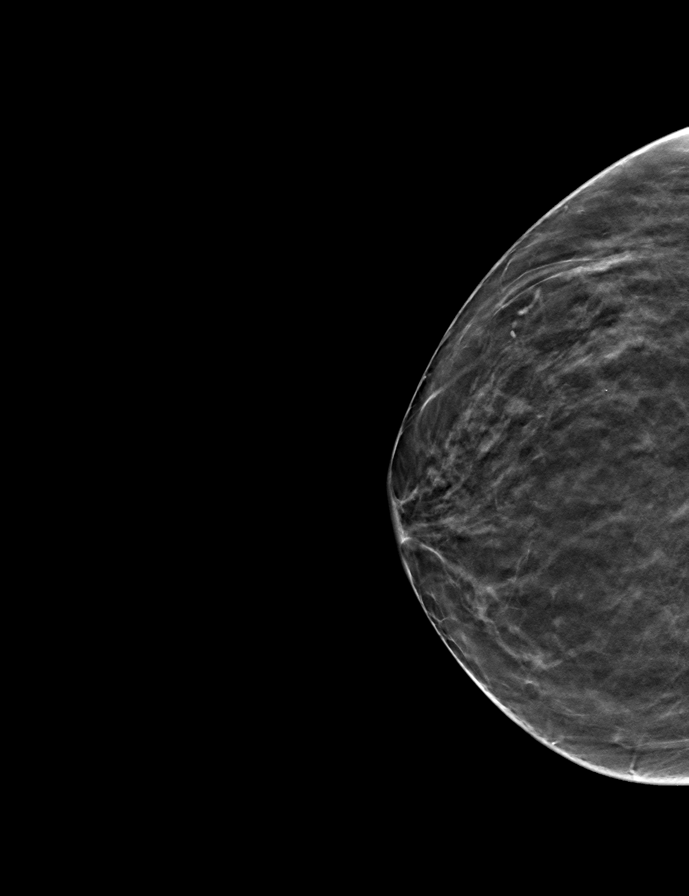
[frame 27/54]
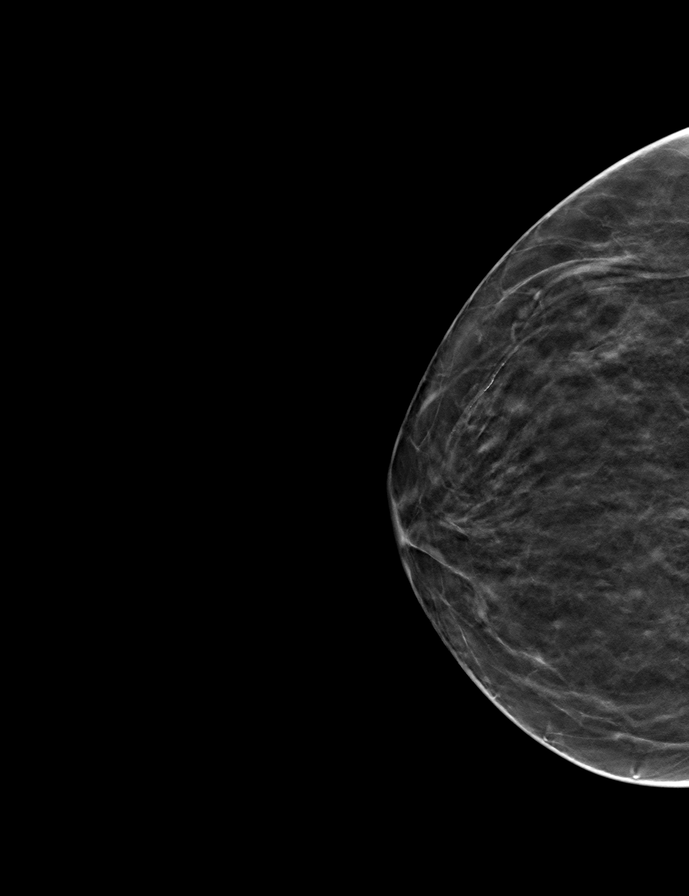

[L CC tomo · tomo slice 29/57.0]
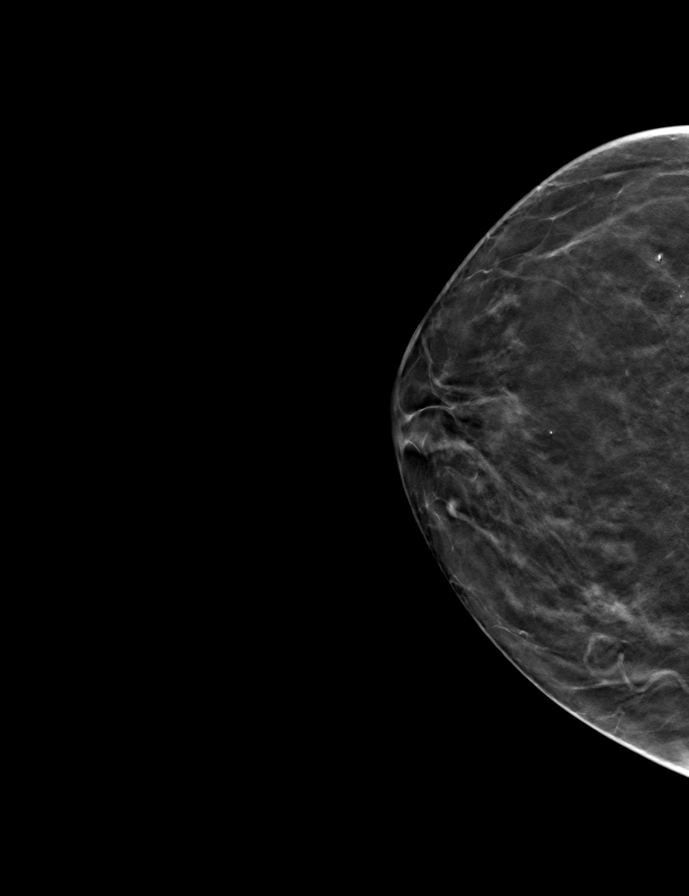

[L MLO tomo · tomo slice 29/57.0]
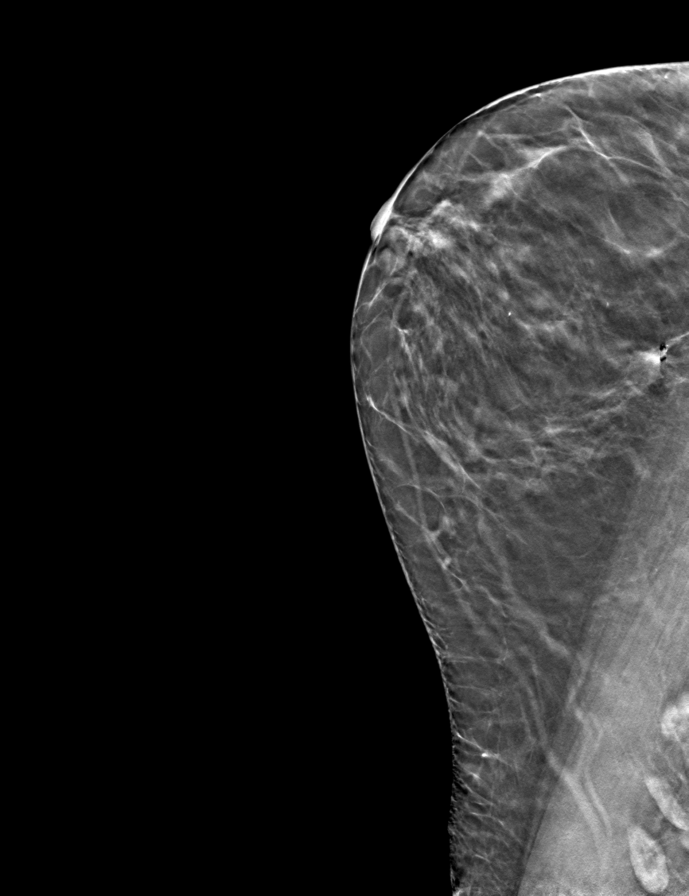

[R MLO tomo · tomo slice 29/58.0]
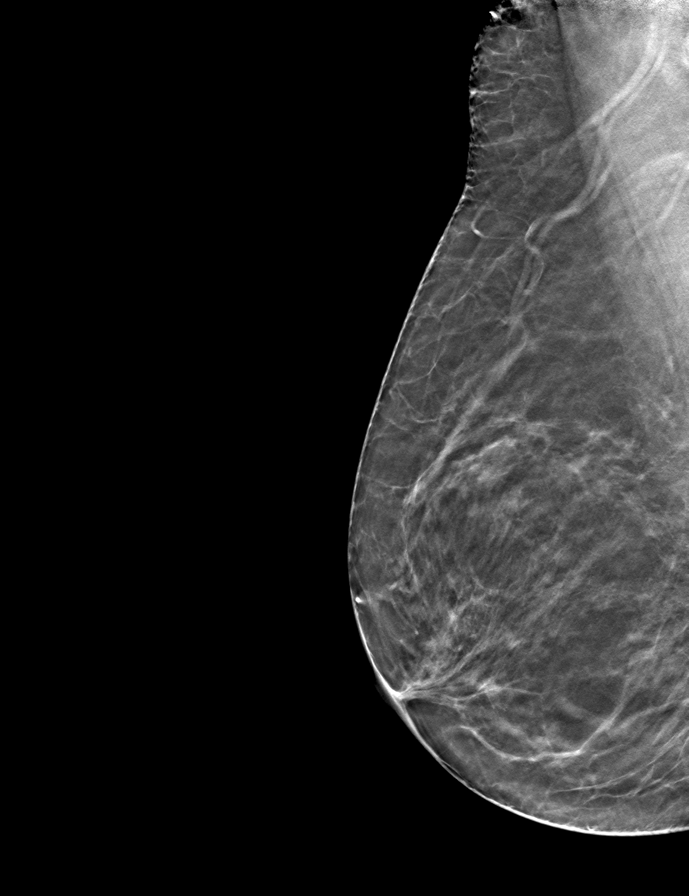

[9 of 24 positions shown; findings below may reference images not displayed]

ACR Breast Density Category c: The breast tissue is heterogeneously
dense, which may obscure small masses.
FINDINGS: There are no findings suspicious for malignancy. Images were
processed with CAD.
IMPRESSION: No mammographic evidence of malignancy. A result letter of this
screening mammogram will be mailed directly to the patient.

RECOMMENDATION:
Screening mammogram in one year. (Code:[id number]-[id number])

BI-RADS CATEGORY  1: Negative.

## 2021-08-20 ENCOUNTER — Other Ambulatory Visit: Payer: Self-pay | Admitting: Internal Medicine

## 2021-08-20 DIAGNOSIS — Z1231 Encounter for screening mammogram for malignant neoplasm of breast: Secondary | ICD-10-CM

## 2021-08-22 ENCOUNTER — Other Ambulatory Visit: Payer: Self-pay

## 2021-08-22 ENCOUNTER — Inpatient Hospital Stay
Admission: EM | Admit: 2021-08-22 | Discharge: 2021-08-25 | DRG: 392 | Disposition: A | Discharge status: Home / Self Care | Payer: Medicare HMO | Attending: Internal Medicine | Admitting: Internal Medicine

## 2021-08-22 ENCOUNTER — Emergency Department: Payer: Medicare HMO

## 2021-08-22 DIAGNOSIS — Z803 Family history of malignant neoplasm of breast: Secondary | ICD-10-CM

## 2021-08-22 DIAGNOSIS — K529 Noninfective gastroenteritis and colitis, unspecified: Secondary | ICD-10-CM | POA: Diagnosis present

## 2021-08-22 DIAGNOSIS — Z7982 Long term (current) use of aspirin: Secondary | ICD-10-CM

## 2021-08-22 DIAGNOSIS — I10 Essential (primary) hypertension: Secondary | ICD-10-CM | POA: Diagnosis present

## 2021-08-22 DIAGNOSIS — I252 Old myocardial infarction: Secondary | ICD-10-CM

## 2021-08-22 DIAGNOSIS — I2510 Atherosclerotic heart disease of native coronary artery without angina pectoris: Secondary | ICD-10-CM | POA: Diagnosis present

## 2021-08-22 DIAGNOSIS — K5732 Diverticulitis of large intestine without perforation or abscess without bleeding: Secondary | ICD-10-CM | POA: Diagnosis present

## 2021-08-22 DIAGNOSIS — Z79899 Other long term (current) drug therapy: Secondary | ICD-10-CM | POA: Diagnosis not present

## 2021-08-22 DIAGNOSIS — F418 Other specified anxiety disorders: Secondary | ICD-10-CM | POA: Diagnosis present

## 2021-08-22 DIAGNOSIS — E039 Hypothyroidism, unspecified: Secondary | ICD-10-CM | POA: Diagnosis present

## 2021-08-22 DIAGNOSIS — I700 Atherosclerosis of aorta: Secondary | ICD-10-CM | POA: Diagnosis present

## 2021-08-22 DIAGNOSIS — K648 Other hemorrhoids: Secondary | ICD-10-CM | POA: Diagnosis present

## 2021-08-22 DIAGNOSIS — Z823 Family history of stroke: Secondary | ICD-10-CM | POA: Diagnosis not present

## 2021-08-22 DIAGNOSIS — Z79890 Hormone replacement therapy (postmenopausal): Secondary | ICD-10-CM | POA: Diagnosis not present

## 2021-08-22 DIAGNOSIS — K922 Gastrointestinal hemorrhage, unspecified: Principal | ICD-10-CM

## 2021-08-22 DIAGNOSIS — Z8249 Family history of ischemic heart disease and other diseases of the circulatory system: Secondary | ICD-10-CM

## 2021-08-22 DIAGNOSIS — A09 Infectious gastroenteritis and colitis, unspecified: Principal | ICD-10-CM | POA: Diagnosis present

## 2021-08-22 DIAGNOSIS — I129 Hypertensive chronic kidney disease with stage 1 through stage 4 chronic kidney disease, or unspecified chronic kidney disease: Secondary | ICD-10-CM | POA: Diagnosis present

## 2021-08-22 DIAGNOSIS — M779 Enthesopathy, unspecified: Secondary | ICD-10-CM | POA: Diagnosis present

## 2021-08-22 DIAGNOSIS — N1831 Chronic kidney disease, stage 3a: Secondary | ICD-10-CM | POA: Diagnosis present

## 2021-08-22 LAB — CBC
HCT: 37.9 % (ref 36.0–46.0)
HCT: 32.6 % — ABNORMAL LOW (ref 36.0–46.0)
HCT: 33.6 % — ABNORMAL LOW (ref 36.0–46.0)
Hemoglobin: 12.4 g/dL (ref 12.0–15.0)
Hemoglobin: 10.7 g/dL — ABNORMAL LOW (ref 12.0–15.0)
Hemoglobin: 11.1 g/dL — ABNORMAL LOW (ref 12.0–15.0)
MCH: 28.4 pg (ref 26.0–34.0)
MCH: 28.5 pg (ref 26.0–34.0)
MCH: 28.7 pg (ref 26.0–34.0)
MCHC: 32.7 g/dL (ref 30.0–36.0)
MCHC: 32.8 g/dL (ref 30.0–36.0)
MCHC: 33 g/dL (ref 30.0–36.0)
MCV: 86.9 fL (ref 80.0–100.0)
MCV: 86.7 fL (ref 80.0–100.0)
MCV: 86.8 fL (ref 80.0–100.0)
Platelets: 174 10*3/uL (ref 150–400)
Platelets: 144 10*3/uL — ABNORMAL LOW (ref 150–400)
Platelets: 143 10*3/uL — ABNORMAL LOW (ref 150–400)
RBC: 4.36 MIL/uL (ref 3.87–5.11)
RBC: 3.76 MIL/uL — ABNORMAL LOW (ref 3.87–5.11)
RBC: 3.87 MIL/uL (ref 3.87–5.11)
RDW: 13.1 % (ref 11.5–15.5)
RDW: 13.2 % (ref 11.5–15.5)
RDW: 13.2 % (ref 11.5–15.5)
WBC: 11.6 10*3/uL — ABNORMAL HIGH (ref 4.0–10.5)
WBC: 9.4 10*3/uL (ref 4.0–10.5)
WBC: 9.7 10*3/uL (ref 4.0–10.5)
nRBC: 0 % (ref 0.0–0.2)
nRBC: 0 % (ref 0.0–0.2)
nRBC: 0 % (ref 0.0–0.2)

## 2021-08-22 LAB — COMPREHENSIVE METABOLIC PANEL
ALT: 29 U/L (ref 0–44)
AST: 32 U/L (ref 15–41)
Albumin: 4.3 g/dL (ref 3.5–5.0)
Alkaline Phosphatase: 78 U/L (ref 38–126)
Anion gap: 12 (ref 5–15)
BUN: 25 mg/dL — ABNORMAL HIGH (ref 8–23)
CO2: 23 mmol/L (ref 22–32)
Calcium: 9.7 mg/dL (ref 8.9–10.3)
Chloride: 104 mmol/L (ref 98–111)
Creatinine, Ser: 1.03 mg/dL — ABNORMAL HIGH (ref 0.44–1.00)
GFR, Estimated: 54 mL/min — ABNORMAL LOW (ref >60)
Glucose, Bld: 132 mg/dL — ABNORMAL HIGH (ref 70–99)
Potassium: 3.6 mmol/L (ref 3.5–5.1)
Sodium: 139 mmol/L (ref 135–145)
Total Bilirubin: 0.8 mg/dL (ref 0.3–1.2)
Total Protein: 7.4 g/dL (ref 6.5–8.1)

## 2021-08-22 LAB — TYPE AND SCREEN
ABO/RH(D): O POS
Antibody Screen: NEGATIVE

## 2021-08-22 LAB — PROTIME-INR
INR: 1.1 (ref 0.8–1.2)
Prothrombin Time: 13.6 seconds (ref 11.4–15.2)

## 2021-08-22 LAB — LACTIC ACID, PLASMA: Lactic Acid, Venous: 1.1 mmol/L (ref 0.5–1.9)

## 2021-08-22 MED ORDER — LEVOTHYROXINE SODIUM 50 MCG PO TABS
75.0000 ug | ORAL_TABLET | Freq: Every day | ORAL | Status: DC
  Administered 2021-08-23 – 2021-08-25 (×3): 75 ug via ORAL
  Filled 2021-08-22 (×2): qty 2

## 2021-08-22 MED ORDER — LISINOPRIL 10 MG PO TABS
5.0000 mg | ORAL_TABLET | Freq: Every day | ORAL | Status: DC
  Administered 2021-08-22 – 2021-08-25 (×4): 5 mg via ORAL
  Filled 2021-08-22 (×4): qty 1

## 2021-08-22 MED ORDER — SODIUM CHLORIDE 0.9 % IV BOLUS
500.0000 mL | Freq: Once | INTRAVENOUS | Status: AC
Start: 2021-08-22 — End: 2021-08-22
  Administered 2021-08-22: 500 mL via INTRAVENOUS

## 2021-08-22 MED ORDER — ADULT MULTIVITAMIN W/MINERALS CH
ORAL_TABLET | Freq: Every day | ORAL | Status: DC
  Administered 2021-08-22 – 2021-08-25 (×4): 1 via ORAL
  Filled 2021-08-22 (×4): qty 1

## 2021-08-22 MED ORDER — METRONIDAZOLE 500 MG/100ML IV SOLN
500.0000 mg | Freq: Two times a day (BID) | INTRAVENOUS | Status: DC
  Administered 2021-08-23 – 2021-08-25 (×5): 500 mg via INTRAVENOUS
  Filled 2021-08-22 (×6): qty 100

## 2021-08-22 MED ORDER — ACETAMINOPHEN 325 MG PO TABS
650.0000 mg | ORAL_TABLET | Freq: Four times a day (QID) | ORAL | Status: DC | PRN
  Administered 2021-08-22 – 2021-08-25 (×3): 650 mg via ORAL
  Filled 2021-08-22 (×3): qty 2

## 2021-08-22 MED ORDER — SODIUM CHLORIDE 0.9 % IV SOLN
2.0000 g | INTRAVENOUS | Status: DC
  Administered 2021-08-23 – 2021-08-24 (×2): 2 g via INTRAVENOUS
  Filled 2021-08-22: qty 20
  Filled 2021-08-22: qty 2
  Filled 2021-08-22: qty 20

## 2021-08-22 MED ORDER — SODIUM CHLORIDE 0.9 % IV BOLUS
1000.0000 mL | Freq: Once | INTRAVENOUS | Status: AC
  Administered 2021-08-22: 1000 mL via INTRAVENOUS

## 2021-08-22 MED ORDER — AMLODIPINE BESYLATE 10 MG PO TABS
10.0000 mg | ORAL_TABLET | Freq: Every day | ORAL | Status: DC
  Administered 2021-08-22 – 2021-08-25 (×4): 10 mg via ORAL
  Filled 2021-08-22 (×4): qty 1

## 2021-08-22 MED ORDER — HYDRALAZINE HCL 20 MG/ML IJ SOLN: 5.0000 mg | INTRAMUSCULAR | Status: DC | PRN

## 2021-08-22 MED ORDER — SODIUM CHLORIDE 0.9 % IV SOLN
2.0000 g | Freq: Once | INTRAVENOUS | Status: AC
  Administered 2021-08-22: 2 g via INTRAVENOUS
  Filled 2021-08-22: qty 20

## 2021-08-22 MED ORDER — SODIUM CHLORIDE 0.9 % IV SOLN: INTRAVENOUS | Status: DC

## 2021-08-22 MED ORDER — FENTANYL CITRATE PF 50 MCG/ML IJ SOSY: 12.5000 ug | PREFILLED_SYRINGE | INTRAMUSCULAR | Status: DC | PRN

## 2021-08-22 MED ORDER — ALPRAZOLAM 0.25 MG PO TABS
0.2500 mg | ORAL_TABLET | Freq: Two times a day (BID) | ORAL | Status: DC | PRN
  Administered 2021-08-22 – 2021-08-24 (×3): 0.25 mg via ORAL
  Filled 2021-08-22 (×3): qty 1

## 2021-08-22 MED ORDER — ONDANSETRON HCL 4 MG/2ML IJ SOLN
4.0000 mg | Freq: Three times a day (TID) | INTRAMUSCULAR | Status: DC | PRN
Start: 2021-08-22 — End: 2021-08-25

## 2021-08-22 MED ORDER — METRONIDAZOLE 500 MG/100ML IV SOLN
500.0000 mg | Freq: Once | INTRAVENOUS | Status: AC
  Administered 2021-08-22: 500 mg via INTRAVENOUS
  Filled 2021-08-22: qty 100

## 2021-08-22 MED ORDER — METOPROLOL TARTRATE 25 MG PO TABS
25.0000 mg | ORAL_TABLET | Freq: Two times a day (BID) | ORAL | Status: DC
  Administered 2021-08-22 – 2021-08-25 (×6): 25 mg via ORAL
  Filled 2021-08-22 (×6): qty 1

## 2021-08-22 MED ORDER — IOHEXOL 300 MG/ML  SOLN
100.0000 mL | Freq: Once | INTRAMUSCULAR | Status: AC | PRN
  Administered 2021-08-22: 100 mL via INTRAVENOUS

## 2021-08-22 NOTE — Assessment & Plan Note (Addendum)
Patient with bloody diarrhea and abdominal pain.  CT with sigmoid colitis.  Bleeding diarrhea have since stopped.  No abdominal pain currently.  Continue IV antibiotics.  GI consulted and plans to do colonoscopy on 5/9.  Colonoscopy showed internal hemorrhoid which prolapsed easily with straining, diverticulosis. Congested mucosa in the sigmoid colon which were biopsied. ?Advance to regular diet ?

## 2021-08-22 NOTE — Assessment & Plan Note (Addendum)
-  IV hydralazine as needed ?-Continue amlodipine, lisinopril, metoprolol ?

## 2021-08-22 NOTE — Consult Note (Signed)
? ? ?GI Inpatient Consult Note ? ?Reason for Consult: Left-sided colitis  ?  ?Attending Requesting Consult: Dr. Ivor Costa ? ?History of Present Illness: ?Michele [name] is a 87 y.o. female seen for evaluation of left-sided colitis at the request of admitting hospitalist - Dr. Ivor Costa. Patient has a PMH of HTN, hypothyroidism, CKD Stage III, Hx of NSTEMI 2020, PSVT, and stress-induced cardiomyopathy. She presented to the [facility] ED this morning for chief complaint of lower abdominal cramping and hematochezia. She reports symptoms started around 2 PM yesterday afternoon and reports she noticed acute onset bilateral lower abdominal cramping associated with urge to have a bowel movement. She reports she had loose, watery brown bowel movement with no blood seen. She reports she then had several loose BMs with blood in the stool described as "red flakes and light red chunks." She reports she got concerned when she woke up this morning and felt blood coming out of her so she came to the ED. Upon presentation to the ED, she was hypertensive (174/74) and tachycardic (120) and otherwise normal vital signs. Labs were significant for mild leukocytosis with WBC 11.6K, hemoglobin 12.4, hematocrit 37.9, normal serum electrolytes, BUN 25, serum creatinine 1.03, INR 1.1, and lactic acid 1.1. CT abd/pelvis with contrast showed moderate colitis involving the descending and sigmoid colon with no evidence of bowel obstruction, bowel perforation, or intra-abdominal abscess. She was started on empiric broad-spectrum antibiotics with Ceftriaxone and Flagyl and admitted to the unit. GI consulted for further evaluation and management.  ? ?Patient seen and examined this afternoon resting comfortably in hospital bed. She reports her abdominal cramping has improved and is not as severe today. She has not had any further episodes of gross hematochezia since being in the ED. She denies any history of GI bleeding. She denies any recent sick contacts  or foreign travel. No recent antibiotic use. No shellfish or raw seafood. She reports she felt fine when she woke up yesterday and had her typical oatmeal for breakfast. She denies any fevers, chills, or night sweats. She does endorse mild nausea with no vomiting. She has been under a considerable amount of stress at home since she lost her husband of 19 years in January of this year unexpectedly. She also reports her son committed suicide three years ago. She has hx of stress-induced cardiomyopathy and follows with [facility]. No recent cardiac events. She does not use NSAID products frequently. No recent medication changes per patient. Last colonoscopy 08/2005 by Dr. Rhona Leavens showed normal examined colon with good prep. She denies any personal hx of adenomatous colon polyps. No known family history of any GI malignancies.  ? ?Past Medical History:  ?Past Medical History:  ?Diagnosis Date  ? Hypertension   ? Hypothyroidism   ? Mitral regurgitation   ? PVC's (premature ventricular contractions)   ? Right bundle branch block   ? Seasonal allergies   ?  ?Problem List: ?Patient Active Problem List  ? Diagnosis Date Noted  ? Colitis, acute 08/22/2021  ? Hypothyroidism   ? Hypertension   ? Depression with anxiety   ? CAD (coronary artery disease)   ? Chest pain 07/02/2018  ? NSTEMI (non-ST elevated myocardial infarction) ([place]) 07/02/2018  ?  ?Past Surgical History: ?Past Surgical History:  ?Procedure Laterality Date  ? ABDOMINAL HYSTERECTOMY    ? BREAST BIOPSY Left 04/27/2016  ? FIBROADENOMATOUS CHANGES WITH STROMAL HYALINIZATION AND   ? CATARACT EXTRACTION    ? LEFT HEART CATH AND CORONARY  ANGIOGRAPHY N/A 07/02/2018  ? Procedure: LEFT HEART CATH AND CORONARY ANGIOGRAPHY;  Surgeon: Corey Skains, MD;  Location: [facility];  Service: Cardiovascular;  Laterality: N/A;  ? Right elbow tendonitis    ? ROTATOR CUFF REPAIR Bilateral   ?  ?Allergies: ?Allergies  ?Allergen Reactions  ?  Oxycodone-Acetaminophen Other (See Comments)  ?  Also constipation   ?  ?Home Medications: ?(Not in a hospital admission) ? ?Home medication reconciliation was completed with the patient.  ? ?Scheduled Inpatient Medications: ?  ? ?Continuous Inpatient Infusions: ?  ? sodium chloride    ? [START ON 08/23/2021] cefTRIAXone (ROCEPHIN)  IV    ? metronidazole    ? sodium chloride    ? ? ?PRN Inpatient Medications:  ?hydrALAZINE, ondansetron (ZOFRAN) IV ? ?Family History: ?family history includes Breast cancer in her cousin; CVA in her mother; Coronary artery disease in her brother.  The patient's family history is negative for inflammatory bowel disorders, GI malignancy, or solid organ transplantation. ? ?Social History:  ? reports that she has never smoked. She has never used smokeless tobacco. She reports that she does not use drugs. The patient denies ETOH, tobacco, or drug use.  ? ?Review of Systems: ?Constitutional: Weight is stable.  ?Eyes: No changes in vision. ?ENT: No oral lesions, sore throat.  ?GI: see HPI.  ?Heme/Lymph: No easy bruising.  ?CV: No chest pain.  ?GU: No hematuria.  ?Integumentary: No rashes.  ?Neuro: No headaches.  ?Psych: No depression/anxiety.  ?Endocrine: No heat/cold intolerance.  ?Allergic/Immunologic: No urticaria.  ?Resp: No cough, SOB.  ?Musculoskeletal: No joint swelling.  ?  ?Physical Examination: ?BP (!) 178/68   Pulse 89   Temp 99 ?F (37.2 ?C) (Oral)   Resp 15   Wt 60.3 kg   SpO2 99%   BMI 22.13 kg/m?  ?Gen: NAD, alert and oriented x 4 ?HEENT: PEERLA, EOMI, ?Neck: supple, no JVD or thyromegaly ?Chest: CTA bilaterally, no wheezes, crackles, or other adventitious sounds ?CV: RRR, no m/g/c/r ?Abd: soft, nondistended, normoactive bowel sounds in all four quadrants, mild tenderness to deep palpation in RLQ and suprapubic regions, no LLQ TTP, no epigastric TTP, no HSM, guarding, ridigity, or rebound tenderness ?Ext: no edema, well perfused with 2+ pulses, ?Skin: no rash or lesions  noted ?Lymph: no LAD ? ?Data: ?Lab Results  ?Component Value Date  ? WBC 11.6 (H) 08/22/2021  ? HGB 12.4 08/22/2021  ? HCT 37.9 08/22/2021  ? MCV 86.9 08/22/2021  ? PLT 174 08/22/2021  ? ?Recent Labs  ?Lab 08/22/21 ?0810  ?HGB 12.4  ? ?Lab Results  ?Component Value Date  ? NA 139 08/22/2021  ? K 3.6 08/22/2021  ? CL 104 08/22/2021  ? CO2 23 08/22/2021  ? BUN 25 (H) 08/22/2021  ? CREATININE 1.03 (H) 08/22/2021  ? ?Lab Results  ?Component Value Date  ? ALT 29 08/22/2021  ? AST 32 08/22/2021  ? ALKPHOS 78 08/22/2021  ? BILITOT 0.8 08/22/2021  ? ?Recent Labs  ?Lab 08/22/21 ?1007  ?INR 1.1  ? ?CT abd/pelvis with contrast 08/22/21: ?IMPRESSION: ?Moderate colitis involving the descending and sigmoid colon. No ?evidence of bowel perforation or abscess. ?  ?Aortic Atherosclerosis (ICD10-I70.0). ? ?Assessment/Plan: ? ?87 y/o Caucasian female with a PMH of HTN, hypothyroidism, CKD Stage III, Hx of NSTEMI, PSVT, and stress-induced cardiomyopathy presented to the [facility] ED today for chief complaint of acute onset lower abdominal cramping and hematochezia. GI consulted for further evaluation and management.  ? ?Left-sided colitis -  suspect ischemic colitis based on clinical presentation and imaging likely due to transient hypoperfusion in setting of hypovolemia. DDx also includes bacterial or viral gastroenteritis, inflammatory colitis, stercoral colitis, medication-induced colitis, colorectal neoplasm, SCAD, etc ? ?Mild leukocytosis (11.6K) ? ?CKD Stage IIIa  ? ?Stress-induced cardiomyopathy  ? ?Recommendations: ? ?- Maintain 2 large bore IVs for access ?- H&H stable with no active GI bleeding ?- Continue to monitor serial H&H. Transfuse if Hgb <7.0.  ?- Collect stool studies to rule out bacterial or viral pathogens ?- Continue broad-spectrum antibiotic coverage for now. If stool studies are negative, can discontinue antibiotics.  ?- Serial abdominal examinations ?- Supportive care with antiemetics, pain control, and IV fluid  hydration ?- Full liquid diet for now ?- Colonoscopy when clinically feasible ?- Following along with you  ? ?Thank you for the consult. Please call with questions or concerns. ? ?Geanie Kenning, PA-C ?[facility]

## 2021-08-22 NOTE — ED Provider Notes (Signed)
? ?[facility] ?Provider Note ? ? ? Event Date/Time  ? First MD Initiated Contact with Patient 08/22/21 [phone]   ?  (approximate) ? ? ?History  ? ?Rectal Bleeding ? ? ?HPI ? ?Michele [name] is a 87 y.o. female with past medical history of hypertension, hypothyroidism, here with abdominal pain and bleeding.  The patient states that for the last day, she has had progressively worsening lower abdominal pain.  Described as an aching, cramping pain.  She then began having initially a regular bowel movement then loose bowel movements that became progressively more bloody over the last 12 hours or so.  She has had multiple grossly bloody bowel movements.  She has a history of hemorrhoids initially thought it was due to that but she has now had multiple passages of clots.  She said ongoing lower abdominal pain.  She had fatigue but no known fevers.  No known recent sick contacts.  No suspicious food intake.  No recent antibiotic use.  She has a history of diverticulosis according to her, but no history of hospitalization for this ? ? ?Physical Exam  ? ?Triage Vital Signs: ?ED Triage Vitals  ?Enc Vitals Group  ?   BP 08/22/21 0807 (!) 174/74  ?   Pulse Rate 08/22/21 0807 (!) 120  ?   Resp 08/22/21 0807 19  ?   Temp 08/22/21 0807 99 ?F (37.2 ?C)  ?   Temp Source 08/22/21 0807 Oral  ?   SpO2 08/22/21 0807 98 %  ?   Weight 08/22/21 0808 133 lb (60.3 kg)  ?   Height --   ?   Head Circumference --   ?   Peak Flow --   ?   Pain Score 08/22/21 0808 0  ?   Pain Loc --   ?   Pain Edu? --   ?   Excl. in GC? --   ? ? ?Most recent vital signs: ?Vitals:  ? 08/22/21 1600 08/22/21 2040  ?BP: 129/89 (!) 161/52  ?Pulse: 85 83  ?Resp: 20 18  ?Temp: 97.7 ?F (36.5 ?C) 99 ?F (37.2 ?C)  ?SpO2:  98%  ? ? ? ?General: Awake, no distress.  ?CV:  Good peripheral perfusion.  Regular rate and rhythm. ?Resp:  Normal effort.  Lungs clear to auscultation bilaterally. ?Abd:  No distention.  Moderate left lower quadrant tenderness.  No  rebound or guarding. ?Other:  Mildly dry mucous membranes. ? ? ?ED Results / Procedures / Treatments  ? ?Labs ?(all labs ordered are listed, but only abnormal results are displayed) ?Labs Reviewed  ?COMPREHENSIVE METABOLIC PANEL - Abnormal; Notable for the following components:  ?    Result Value  ? Glucose, Bld 132 (*)   ? BUN 25 (*)   ? Creatinine, Ser 1.03 (*)   ? GFR, Estimated 54 (*)   ? All other components within normal limits  ?CBC - Abnormal; Notable for the following components:  ? WBC 11.6 (*)   ? All other components within normal limits  ?CBC - Abnormal; Notable for the following components:  ? RBC 3.76 (*)   ? Hemoglobin 10.7 (*)   ? HCT 32.6 (*)   ? Platelets 144 (*)   ? All other components within normal limits  ?CULTURE, BLOOD (SINGLE)  ?C DIFFICILE QUICK SCREEN W PCR REFLEX    ?GASTROINTESTINAL PANEL BY PCR, STOOL (REPLACES STOOL CULTURE)  ?LACTIC ACID, PLASMA  ?PROTIME-INR  ?CBC  ?CBC  ?BASIC METABOLIC PANEL  ?  CBC  ?CBC  ?POC OCCULT BLOOD, ED  ?TYPE AND SCREEN  ? ? ? ?EKG ? ? ? ?RADIOLOGY ?CT abdomen/pelvis: Moderate colitis involving the descending and sigmoid colon.  No abscess. ? ? ?I also independently reviewed and agree with radiologist interpretations. ? ? ?PROCEDURES: ? ?Critical Care performed: No ? ?.1-3 Lead EKG Interpretation ?Performed by: Shaune Pollack, MD ?Authorized by: Shaune Pollack, MD  ? ?  Interpretation: non-specific   ?  ECG rate:  90-110 ?  ECG rate assessment: tachycardic   ?  Rhythm: sinus rhythm   ?  Ectopy: none   ?  Conduction: normal   ?Comments:  ?   Indication: abdominal pain, weakness ? ? ? ?MEDICATIONS ORDERED IN ED: ?Medications  ?0.9 %  sodium chloride infusion ( Intravenous New Bag/Given 08/22/21 2053)  ?ondansetron (ZOFRAN) injection 4 mg (has no administration in time range)  ?hydrALAZINE (APRESOLINE) injection 5 mg (has no administration in time range)  ?cefTRIAXone (ROCEPHIN) 2 g in sodium chloride 0.9 % 100 mL IVPB (has no administration in time range)   ?metroNIDAZOLE (FLAGYL) IVPB 500 mg (has no administration in time range)  ?amLODipine (NORVASC) tablet 10 mg (10 mg Oral Given 08/22/21 1740)  ?lisinopril (ZESTRIL) tablet 5 mg (5 mg Oral Given 08/22/21 1739)  ?metoprolol tartrate (LOPRESSOR) tablet 25 mg (has no administration in time range)  ?ALPRAZolam (XANAX) tablet 0.25 mg (has no administration in time range)  ?levothyroxine (SYNTHROID) tablet 75 mcg (has no administration in time range)  ?multivitamin with minerals tablet (1 tablet Oral Given 08/22/21 1739)  ?acetaminophen (TYLENOL) tablet 650 mg (has no administration in time range)  ?fentaNYL (SUBLIMAZE) injection 12.5 mcg (has no administration in time range)  ?sodium chloride 0.9 % bolus 1,000 mL (0 mLs Intravenous Stopped 08/22/21 1342)  ?iohexol (OMNIPAQUE) 300 MG/ML solution 100 mL (100 mLs Intravenous Contrast Given 08/22/21 1056)  ?cefTRIAXone (ROCEPHIN) 2 g in sodium chloride 0.9 % 100 mL IVPB (0 g Intravenous Stopped 08/22/21 1342)  ?metroNIDAZOLE (FLAGYL) IVPB 500 mg (0 mg Intravenous Stopped 08/22/21 1342)  ?sodium chloride 0.9 % bolus 500 mL (500 mLs Intravenous New Bag/Given 08/22/21 1438)  ? ? ? ?IMPRESSION / MDM / ASSESSMENT AND PLAN / ED COURSE  ?I reviewed the triage vital signs and the nursing notes. ?             ?               ? ? ?The patient is on the cardiac monitor to evaluate for evidence of arrhythmia and/or significant heart rate changes. ? ? ?MDM:  ?87 yo F with PMHx HTN, depression, PVC, MVR, CAD, here with abdominal pain, bloody BM. Suspect diverticulitis versus diverticulosis w/ bleed. Less likely hemorrhoid related bleeding. Pt is not on anticoagulation. CBC shows mild leukocytosis. LA normal. CMP with normal renal function.Hgb 12.4. Pt not on anticoagulation. CT A/P obtained given TTP, and shows moderate coolitis involving the descending and sigmoid colon. No perforation or abscess. ? ?Will admit for abx, montioring given ongoing LGIB with diverticulitis/colitis.   ? ? ?MEDICATIONS GIVEN  IN ED: ?Medications  ?0.9 %  sodium chloride infusion ( Intravenous New Bag/Given 08/22/21 2053)  ?ondansetron (ZOFRAN) injection 4 mg (has no administration in time range)  ?hydrALAZINE (APRESOLINE) injection 5 mg (has no administration in time range)  ?cefTRIAXone (ROCEPHIN) 2 g in sodium chloride 0.9 % 100 mL IVPB (has no administration in time range)  ?metroNIDAZOLE (FLAGYL) IVPB 500 mg (has no administration in time range)  ?  amLODipine (NORVASC) tablet 10 mg (10 mg Oral Given 08/22/21 1740)  ?lisinopril (ZESTRIL) tablet 5 mg (5 mg Oral Given 08/22/21 1739)  ?metoprolol tartrate (LOPRESSOR) tablet 25 mg (has no administration in time range)  ?ALPRAZolam (XANAX) tablet 0.25 mg (has no administration in time range)  ?levothyroxine (SYNTHROID) tablet 75 mcg (has no administration in time range)  ?multivitamin with minerals tablet (1 tablet Oral Given 08/22/21 1739)  ?acetaminophen (TYLENOL) tablet 650 mg (has no administration in time range)  ?fentaNYL (SUBLIMAZE) injection 12.5 mcg (has no administration in time range)  ?sodium chloride 0.9 % bolus 1,000 mL (0 mLs Intravenous Stopped 08/22/21 1342)  ?iohexol (OMNIPAQUE) 300 MG/ML solution 100 mL (100 mLs Intravenous Contrast Given 08/22/21 1056)  ?cefTRIAXone (ROCEPHIN) 2 g in sodium chloride 0.9 % 100 mL IVPB (0 g Intravenous Stopped 08/22/21 1342)  ?metroNIDAZOLE (FLAGYL) IVPB 500 mg (0 mg Intravenous Stopped 08/22/21 1342)  ?sodium chloride 0.9 % bolus 500 mL (500 mLs Intravenous New Bag/Given 08/22/21 1438)  ? ? ? ?Consults:  ?Hospitalist ? ? ?EMR reviewed  ?Reviewed prior PCP notes including Cardiology visit with Maralyn Sago 3/23 ? ? ? ? ?FINAL CLINICAL IMPRESSION(S) / ED DIAGNOSES  ? ?Final diagnoses:  ?Lower GI bleed  ?Infectious colitis  ? ? ? ?Rx / DC Orders  ? ?ED Discharge Orders   ? ? None  ? ?  ? ? ? ?Note:  This document was prepared using Dragon voice recognition software and may include unintentional dictation errors. ?  ?Shaune Pollack, MD ?08/22/21 2150 ? ?

## 2021-08-22 NOTE — Assessment & Plan Note (Addendum)
Continue Synthroid °

## 2021-08-22 NOTE — ED Triage Notes (Addendum)
Pt comes pov with rectal bleeding since yesterday. Pt states bright red episodes of bleeding with bathroom trips. Had some loewr abd cramping yesterday but has since eased off. Not on thinners.  ? ?Pt states she lost her husband here recently and has been depressed but denies any SI.  ?

## 2021-08-22 NOTE — Assessment & Plan Note (Addendum)
-   Continue as needed Xanax 

## 2021-08-22 NOTE — H&P (Signed)
?History and Physical  ? ? ?Michele [name] [id number] DOB: [birth year] DOA: 08/22/2021 ? ?Referring MD/NP/PA:  ? ?PCP: [name], MD  ? ?Patient coming from:  The patient is coming from home.  At baseline, pt is independent for most of ADL.       ? ?Chief Complaint: Bloody diarrhea ? ?HPI: Michele [name] is a 87 y.o. female with medical history significant of hypertension, hypothyroidism, depression with anxiety, right bundle blockade, PVC, mitral valve regurgitation, CAD, who presents with bloody diarrhea. ? ?Patient states that she has had multiple episodes of bloody diarrhea since yesterday.  She also has lower abdominal pain, which is mild to moderate, crampy-like, nonradiating, intermittent.  Denies nausea vomiting.  No fever or chills.  No chest pain, shortness breath, cough.  Denies symptoms of UTI ? ?Data Reviewed and ED Course: pt was found to have WBC 11.6, lactic acid 1.1, INR 1.1, creatinine 1.03, BUN 25, temperature 99, blood pressure 186/82, heart rate 120, RR 19, oxygen saturation 98% on room air.  CT of abdomen/pelvis that showed colitis in descending and sigmoid colon.  Patient is admitted to [facility] bed as inpatient.  Dr. [name] of GI is consulted. ? ? ?EKG: Not done in ED, will get one.    ? ? ?Review of Systems:  ? ?General: no fevers, chills, no body weight gain, has fatigue ?HEENT: no blurry vision, hearing changes or sore throat ?Respiratory: no dyspnea, coughing, wheezing ?CV: no chest pain, no palpitations ?GI: no nausea, vomiting, has abdominal pain, bloody diarrhea, no constipation ?GU: no dysuria, burning on urination, increased urinary frequency, hematuria  ?Ext: no leg edema ?Neuro: no unilateral weakness, numbness, or tingling, no vision change or hearing loss ?Skin: no rash, no skin tear. ?MSK: No muscle spasm, no deformity, no limitation of range of movement in spin ?Heme: No easy bruising.  ?Travel history: No recent [place] distant travel. ? ? ?Allergy:  ?Allergies  ?Allergen  Reactions  ? Oxycodone-Acetaminophen Other (See Comments)  ?  Also constipation   ? ? ?Past Medical History:  ?Diagnosis Date  ? Hypertension   ? Hypothyroidism   ? Mitral regurgitation   ? PVC's (premature ventricular contractions)   ? Right bundle branch block   ? Seasonal allergies   ? ? ?Past Surgical History:  ?Procedure Laterality Date  ? ABDOMINAL HYSTERECTOMY    ? BREAST BIOPSY Left 04/27/2016  ? FIBROADENOMATOUS CHANGES WITH STROMAL HYALINIZATION AND   ? CATARACT EXTRACTION    ? LEFT HEART CATH AND CORONARY ANGIOGRAPHY N/A 07/02/2018  ? Procedure: LEFT HEART CATH AND CORONARY ANGIOGRAPHY;  Surgeon: Corey Skains, MD;  Location: [facility];  Service: Cardiovascular;  Laterality: N/A;  ? Right elbow tendonitis    ? ROTATOR CUFF REPAIR Bilateral   ? ? ?Social History:  reports that she has never smoked. She has never used smokeless tobacco. She reports that she does not use drugs. No history on file for alcohol use. ? ?Family History:  ?Family History  ?Problem Relation Age of Onset  ? Breast cancer Cousin   ? CVA Mother   ? Coronary artery disease Brother   ?     Status post MI in late 2s  ?  ? ?Prior to Admission medications   ?Medication Sig Start Date End Date Taking? Authorizing Provider  ?ALPRAZolam (XANAX) 0.25 MG tablet Take 1 tablet (0.25 mg total) by mouth 2 (two) times daily as needed for anxiety. 07/03/18   Loletha Grayer, MD  ?  aspirin EC 81 MG tablet Take 81 mg by mouth daily.    [provider]  ?levothyroxine (SYNTHROID, LEVOTHROID) 75 MCG tablet Take 75 mcg by mouth daily. 06/01/18   [provider]  ?lisinopril (PRINIVIL,ZESTRIL) 5 MG tablet Take 1 tablet (5 mg total) by mouth daily. 07/03/18   Loletha Grayer, MD  ?metoprolol tartrate (LOPRESSOR) 25 MG tablet Take 1 tablet (25 mg total) by mouth 2 (two) times daily. 07/03/18   Loletha Grayer, MD  ? ? ?Physical Exam: ?Vitals:  ? 08/22/21 1300 08/22/21 1330 08/22/21 1534 08/22/21 1600  ?BP: (!) 178/68 (!)  174/153 129/89 129/89  ?Pulse: 89 98 85 85  ?Resp: 15 16 20 20   ?Temp:   97.7 ?F (36.5 ?C) 97.7 ?F (36.5 ?C)  ?TempSrc:   Oral Oral  ?SpO2: 99% 100% 90%   ?Weight:    60.3 kg  ?Height:    5\' 4"  (1.626 m)  ? ?General: Not in acute distress ?HEENT: ?      Eyes: PERRL, EOMI, no scleral icterus. ?      ENT: No discharge from the ears and nose, no pharynx injection, no tonsillar enlargement.  ?      Neck: No JVD, no bruit, no mass felt. ?Heme: No neck lymph node enlargement. ?Cardiac: S1/S2, RRR, No murmurs, No gallops or rubs. ?Respiratory: No rales, wheezing, rhonchi or rubs. ?GI: Soft, nondistended, has tenderness in lower abdomen, no rebound pain, no organomegaly, BS present. ?GU: No hematuria ?Ext: No pitting leg edema bilaterally. 1+DP/PT pulse bilaterally. ?Musculoskeletal: No joint deformities, No joint redness or warmth, no limitation of ROM in spin. ?Skin: No rashes.  ?Neuro: Alert, oriented X3, cranial nerves II-XII grossly intact, moves all extremities normally.  ?Psych: Patient is not psychotic, no suicidal or hemocidal ideation. ? ?Labs on Admission: I have personally reviewed following labs and imaging studies ? ?CBC: ?Recent Labs  ?Lab 08/22/21 ?0810 08/22/21 ?1626  ?WBC 11.6* 9.4  ?HGB 12.4 10.7*  ?HCT 37.9 32.6*  ?MCV 86.9 86.7  ?PLT 174 144*  ? ?Basic Metabolic Panel: ?Recent Labs  ?Lab 08/22/21 ?0810  ?NA 139  ?K 3.6  ?CL 104  ?CO2 23  ?GLUCOSE 132*  ?BUN 25*  ?CREATININE 1.03*  ?CALCIUM 9.7  ? ?GFR: ?Estimated Creatinine Clearance: 35.1 mL/min (A) (by C-G formula based on SCr of 1.03 mg/dL (H)). ?Liver Function Tests: ?Recent Labs  ?Lab 08/22/21 ?0810  ?AST 32  ?ALT 29  ?ALKPHOS 78  ?BILITOT 0.8  ?PROT 7.4  ?ALBUMIN 4.3  ? ?No results for input(s): LIPASE, AMYLASE in the last 168 hours. ?No results for input(s): AMMONIA in the last 168 hours. ?Coagulation Profile: ?Recent Labs  ?Lab 08/22/21 ?1007  ?INR 1.1  ? ?Cardiac Enzymes: ?No results for input(s): CKTOTAL, CKMB, CKMBINDEX, TROPONINI in the last  168 hours. ?BNP (last 3 results) ?No results for input(s): PROBNP in the last 8760 hours. ?HbA1C: ?No results for input(s): HGBA1C in the last 72 hours. ?CBG: ?No results for input(s): GLUCAP in the last 168 hours. ?Lipid Profile: ?No results for input(s): CHOL, HDL, LDLCALC, TRIG, CHOLHDL, LDLDIRECT in the last 72 hours. ?Thyroid Function Tests: ?No results for input(s): TSH, T4TOTAL, FREET4, T3FREE, THYROIDAB in the last 72 hours. ?Anemia Panel: ?No results for input(s): VITAMINB12, FOLATE, FERRITIN, TIBC, IRON, RETICCTPCT in the last 72 hours. ?Urine analysis: ?No results found for: COLORURINE, APPEARANCEUR, [place], [place], [place], [place], [place], KETONESUR, PROTEINUR, [place], NITRITE, LEUKOCYTESUR ?Sepsis Labs: ?@[email or web address] (procalcitonin:4,lacticidven:4) ?)No results found for this or any previous visit (from  the past 240 hour(s)).  ? ?Radiological Exams on Admission: ?CT ABDOMEN PELVIS W CONTRAST ? ?Result Date: 08/22/2021 ?CLINICAL DATA:  Left lower quadrant pain and hematochezia beginning yesterday. EXAM: CT ABDOMEN AND PELVIS WITH CONTRAST TECHNIQUE: Multidetector CT imaging of the abdomen and pelvis was performed using the standard protocol following bolus administration of intravenous contrast. RADIATION DOSE REDUCTION: This exam was performed according to the departmental dose-optimization program which includes automated exposure control, adjustment of the mA and/or kV according to patient size and/or use of iterative reconstruction technique. CONTRAST:  114mL OMNIPAQUE IOHEXOL 300 MG/ML  SOLN COMPARISON:  None Available. FINDINGS: Lower Chest: No acute findings. Hepatobiliary: No hepatic masses identified. A few small cysts are seen mainly in the left hepatic lobe. Gallbladder is unremarkable. No evidence of biliary ductal dilatation. Pancreas:  No mass or inflammatory changes. Spleen: Within normal limits in size and appearance. Adrenals/Urinary Tract: No masses identified. No evidence of  ureteral calculi or hydronephrosis. Stomach/Bowel: Moderate colonic wall thickening and pericolonic soft tissue stranding seen throughout the descending and sigmoid colon, consistent with colitis. No evidence o

## 2021-08-22 NOTE — Assessment & Plan Note (Addendum)
-  Holding aspirin due to GI bleeding ?

## 2021-08-23 DIAGNOSIS — I10 Essential (primary) hypertension: Secondary | ICD-10-CM | POA: Diagnosis not present

## 2021-08-23 DIAGNOSIS — K529 Noninfective gastroenteritis and colitis, unspecified: Secondary | ICD-10-CM

## 2021-08-23 DIAGNOSIS — E039 Hypothyroidism, unspecified: Secondary | ICD-10-CM | POA: Diagnosis not present

## 2021-08-23 DIAGNOSIS — F418 Other specified anxiety disorders: Secondary | ICD-10-CM

## 2021-08-23 LAB — CBC
HCT: 30.6 % — ABNORMAL LOW (ref 36.0–46.0)
HCT: 32 % — ABNORMAL LOW (ref 36.0–46.0)
Hemoglobin: 10.3 g/dL — ABNORMAL LOW (ref 12.0–15.0)
Hemoglobin: 9.9 g/dL — ABNORMAL LOW (ref 12.0–15.0)
MCH: 28.2 pg (ref 26.0–34.0)
MCH: 28.3 pg (ref 26.0–34.0)
MCHC: 32.2 g/dL (ref 30.0–36.0)
MCHC: 32.4 g/dL (ref 30.0–36.0)
MCV: 87.4 fL (ref 80.0–100.0)
MCV: 87.7 fL (ref 80.0–100.0)
Platelets: 127 10*3/uL — ABNORMAL LOW (ref 150–400)
Platelets: 134 10*3/uL — ABNORMAL LOW (ref 150–400)
RBC: 3.5 MIL/uL — ABNORMAL LOW (ref 3.87–5.11)
RBC: 3.65 MIL/uL — ABNORMAL LOW (ref 3.87–5.11)
RDW: 13.2 % (ref 11.5–15.5)
RDW: 13.3 % (ref 11.5–15.5)
WBC: 7.5 10*3/uL (ref 4.0–10.5)
WBC: 9 10*3/uL (ref 4.0–10.5)
nRBC: 0 % (ref 0.0–0.2)
nRBC: 0 % (ref 0.0–0.2)

## 2021-08-23 LAB — GASTROINTESTINAL PANEL BY PCR, STOOL (REPLACES STOOL CULTURE)

## 2021-08-23 LAB — C DIFFICILE QUICK SCREEN W PCR REFLEX
C Diff antigen: NEGATIVE
C Diff interpretation: NOT DETECTED
C Diff toxin: NEGATIVE

## 2021-08-23 LAB — BASIC METABOLIC PANEL
Anion gap: 5 (ref 5–15)
BUN: 11 mg/dL (ref 8–23)
CO2: 27 mmol/L (ref 22–32)
Calcium: 8.6 mg/dL — ABNORMAL LOW (ref 8.9–10.3)
Chloride: 111 mmol/L (ref 98–111)
Creatinine, Ser: 0.71 mg/dL (ref 0.44–1.00)
GFR, Estimated: >60 mL/min (ref >60)
Glucose, Bld: 83 mg/dL (ref 70–99)
Potassium: 3.6 mmol/L (ref 3.5–5.1)
Sodium: 143 mmol/L (ref 135–145)

## 2021-08-23 LAB — GLUCOSE, CAPILLARY: Glucose-Capillary: 81 mg/dL (ref 70–99)

## 2021-08-23 MED ORDER — BISACODYL 5 MG PO TBEC
20.0000 mg | DELAYED_RELEASE_TABLET | Freq: Once | ORAL | Status: AC
  Administered 2021-08-23: 20 mg via ORAL
  Filled 2021-08-23: qty 4

## 2021-08-23 MED ORDER — PEG 3350-KCL-NA BICARB-NACL 420 G PO SOLR
4000.0000 mL | Freq: Once | ORAL | Status: AC
  Administered 2021-08-23: 4000 mL via ORAL
  Filled 2021-08-23: qty 4000

## 2021-08-23 NOTE — Progress Notes (Signed)
Order received from Dr Rito Ehrlich to discontinue telemetry ?

## 2021-08-23 NOTE — Progress Notes (Addendum)
[facility] Progress Note ? ?PatientTremaine Fuhriman [name]    GEZ:[id number]  DOA: 08/22/2021    ?Date of Service: the patient was seen and examined on 08/23/2021 ? ?Brief hospital course: ?Pl[place] with13 year old female with past medical history of hypertension, CAD, depression ?Mitral valve regurg presented to the emergency room on 5/7 with complaints of bloody diarrhea and abdominal pain x1 day.  Work-up revealed colitis.  Patient was given to the hospitalist service and started on IV fluids and antibiotics. ? ?Assessment and Plan: ?Assessment and Plan: ?* Colitis, acute ?Patient with bloody diarrhea and abdominal pain.  CT with sigmoid colitis.  Bleeding diarrhea have since stopped.  No abdominal pain currently.  Continue IV antibiotics.  GI consulted and plans to do colonoscopy on 5/9.  ? ?Hypertension ?-IV hydralazine as needed ?-Amlodipine, lisinopril, metoprolol ? ?Depression with anxiety ?- Continue home as needed Xanax ? ?Hypothyroidism ?- Synthroid ? ?CAD (coronary artery disease) ?-Hold aspirin due to GI bleeding ? ? ? ? ? ? ?Body mass index is 22.83 kg/m?.  ?  ?   ? ?Consultants: ?Gastroenterology ? ?Procedures: ?Planned colonoscopy 5/9 ? ?Antimicrobials: ?IV Rocephin and Flagyl 5/7-present ? ?Code Status: Full code ? ? ?Subjective: Patient with no complaints, no abdominal pain ? ?Objective: ?Some elevated blood pressures ?Vitals:  ? 08/23/21 0728 08/23/21 1537  ?BP: (!) 165/69 97/63  ?Pulse: 83 91  ?Resp: 18 18  ?Temp: 98 ?F (36.7 ?C) 97.7 ?F (36.5 ?C)  ?SpO2: 97% 99%  ? ? ?Intake/Output Summary (Last 24 hours) at 08/23/2021 1740 ?Last data filed at 08/23/2021 1502 ?Gross per 24 hour  ?Intake 668.73 ml  ?Output --  ?Net 668.73 ml  ? ?Filed Weights  ? 08/22/21 0808 08/22/21 1600  ?Weight: 60.3 kg 60.3 kg  ? ?Body mass index is 22.83 kg/m?. ? ?Exam: ? ?General: Alert and oriented x3, no acute distress ?HEENT: Normocephalic and atraumatic, mucous membranes are moist ?Cardiovascular: Regular rate and rhythm,  S1-S2 ?Respiratory: Clear to auscultation bilaterally ?Abdomen: Soft, nontender, nondistended, positive bowel sounds ?Musculoskeletal: No clubbing or cyanosis or edema ?Skin: No skin breaks, tears or lesions ?Psychiatry: Appropriate, no evidence of psychoses ?Neurology: No focal deficits ? ?Data Reviewed: ?Follow-up labs note stabilization of hemoglobin and improvement of renal function ? ?Disposition:  ?Status is: Inpatient ?Remains inpatient appropriate because: Planned colonoscopy ?  ? ?Anticipated discharge date: 5/9 ? ?Remaining issues to be resolved so that patient can be discharged: Colonoscopy ? ? ?Family Communication: Declined for me to call sister ?DVT Prophylaxis: ?SCDs Start: 08/22/21 1232 ? ? ? ?Author: ?Hollice Espy ,MD ?08/23/2021 5:40 PM ? ?To reach On-call, see care teams to locate the attending and reach out via [email or web address]. ?Between 7PM-7AM, please contact night-coverage ?If you still have difficulty reaching the attending provider, please page the [facility] (Director on Call) for [facility] on amion for assistance. ? ?

## 2021-08-23 NOTE — Progress Notes (Signed)
Mobility Specialist - Progress Note ? ? ? 08/23/21 1400  ?Mobility  ?Activity Ambulated independently in hallway;Stood at bedside;Dangled on edge of bed  ?Level of Assistance Contact guard assist, steadying assist  ?Assistive Device None  ?Distance Ambulated (ft) 180 ft  ?Activity Response Tolerated well  ?$Mobility charge 1 Mobility  ? ? ?Pre-mobility: HR, BP, SpO2 ?During mobility: HR, BP, SpO2 ?Post-mobility: HR, BP, SPO2 ? ?Pt semi supine upon arrival using RA. Pt completes bed mobility ModI + extra time and STS ModI. Ambulates 1 lap around nursing station -- mildly unsteady but slef corrects LOB, reaches for walls frequently. Pt is left in bed with needs in reach. ? ?Clarisa Schools ?Mobility Specialist ?08/23/21, 2:28 PM ? ? ?

## 2021-08-23 NOTE — Hospital Course (Addendum)
Plaque with36 year old female with past medical history of hypertension, CAD, depression ?Mitral valve regurg presented to the emergency room on 5/7 with complaints of bloody diarrhea and abdominal pain x1 day.  Work-up revealed colitis.  Patient was given to the hospitalist service and started on IV fluids and antibiotics. ? ?5/9: Colonoscopy showed internal hemorrhoids, diverticulosis ?

## 2021-08-23 NOTE — Plan of Care (Signed)

## 2021-08-23 NOTE — Progress Notes (Signed)
? ?GI Inpatient Follow-up Note ? ?Subjective: ? ?Patient seen in follow-up for left-sided colitis. No acute events overnight. She denies any new complaints or concerns today. Mild abdominal cramping in bilateral lower abdomen. No further BMs since yesterday and no further episodes of hematochezia. She denies any fevers, chills, nausea, vomiting, or altered mental status. Hemoglobin 10.3 this morning.  ? ?Scheduled Inpatient Medications:  ? amLODipine  10 mg Oral Daily  ? levothyroxine  75 mcg Oral Daily  ? lisinopril  5 mg Oral Daily  ? metoprolol tartrate  25 mg Oral BID  ? multivitamin with minerals   Oral Daily  ? ? ?Continuous Inpatient Infusions: ?  ? sodium chloride 75 mL/hr at 08/22/21 2053  ? cefTRIAXone (ROCEPHIN)  IV    ? metronidazole 500 mg (08/23/21 0050)  ? ? ?PRN Inpatient Medications:  ?acetaminophen, ALPRAZolam, fentaNYL (SUBLIMAZE) injection, hydrALAZINE, ondansetron (ZOFRAN) IV ? ?Review of Systems: ?Constitutional: Weight is stable.  ?Eyes: No changes in vision. ?ENT: No oral lesions, sore throat.  ?GI: see HPI.  ?Heme/Lymph: No easy bruising.  ?CV: No chest pain.  ?GU: No hematuria.  ?Integumentary: No rashes.  ?Neuro: No headaches.  ?Psych: No depression/anxiety.  ?Endocrine: No heat/cold intolerance.  ?Allergic/Immunologic: No urticaria.  ?Resp: No cough, SOB.  ?Musculoskeletal: No joint swelling.  ?  ?Physical Examination: ?BP (!) 165/69 (BP Location: Right Arm)   Pulse 83   Temp 98 ?F (36.7 ?C)   Resp 18   Ht 5\' 4"  (1.626 m)   Wt 60.3 kg   SpO2 97%   BMI 22.83 kg/m?  ?Gen: NAD, alert and oriented x 4 ?HEENT: PEERLA, EOMI, ?Neck: supple, no JVD or thyromegaly ?Chest: CTA bilaterally, no wheezes, crackles, or other adventitious sounds ?CV: RRR, no m/g/c/r ?Abd: soft, NT, ND, +BS in all four quadrants; no HSM, guarding, ridigity, or rebound tenderness ?Ext: no edema, well perfused with 2+ pulses, ?Skin: no rash or lesions noted ?Lymph: no LAD ? ?Data: ?Lab Results  ?Component Value Date   ? WBC 7.5 08/23/2021  ? HGB 10.3 (L) 08/23/2021  ? HCT 32.0 (L) 08/23/2021  ? MCV 87.7 08/23/2021  ? PLT 134 (L) 08/23/2021  ? ?Recent Labs  ?Lab 08/22/21 ?2207 08/23/21 ?0015 08/23/21 ?0515  ?HGB 11.1* 9.9* 10.3*  ? ?Lab Results  ?Component Value Date  ? NA 143 08/23/2021  ? K 3.6 08/23/2021  ? CL 111 08/23/2021  ? CO2 27 08/23/2021  ? BUN 11 08/23/2021  ? CREATININE 0.71 08/23/2021  ? ?Lab Results  ?Component Value Date  ? ALT 29 08/22/2021  ? AST 32 08/22/2021  ? ALKPHOS 78 08/22/2021  ? BILITOT 0.8 08/22/2021  ? ?Recent Labs  ?Lab 08/22/21 ?1007  ?INR 1.1  ? ?CT abd/pelvis with contrast 08/22/21: ?IMPRESSION: ?Moderate colitis involving the descending and sigmoid colon. No ?evidence of bowel perforation or abscess. ?  ?Aortic Atherosclerosis (ICD10-I70.0). ? ?Assessment/Plan: ? ?87 y/o Caucasian female with a PMH of HTN, hypothyroidism, CKD Stage III, Hx of NSTEMI, PSVT, and stress-induced cardiomyopathy presented to the [facility] ED today for chief complaint of acute onset lower abdominal cramping and hematochezia. GI consulted for further evaluation and management.  ?  ?Left-sided colitis - suspect ischemic colitis based on clinical presentation and imaging likely due to transient hypoperfusion in setting of hypovolemia. DDx also includes bacterial or viral gastroenteritis, inflammatory colitis, stercoral colitis, medication-induced colitis, colorectal neoplasm, SCAD, etc ?  ?Mild leukocytosis (11.6K) ?  ?CKD Stage IIIa  ?  ?Stress-induced cardiomyopathy  ? ?Recommendations: ? ?-  H&H stable with no evidence of overt gastrointestinal blood loss ?- Clear liquid diet today. NPO after midnight.  ?- Bowel prep orders in for today ?- Plan for colonoscopy tomorrow for luminal evaluation ?- Follow-up on results of stool studies. Continue broad-spectrum antibiotics for now until stool studies result.  ?- See procedure note for findings and further recommendations ? ?I reviewed the risks (including bleeding, perforation,  infection, anesthesia complications, cardiac/respiratory complications), benefits and alternatives of colonoscopy. Patient consents to proceed.  ? ? ?Please call with questions or concerns. ? ? ? ?Octavia Bruckner, PA-C ?[facility] ?[phone] (Clinic #) ? ? ? ? ?

## 2021-08-24 ENCOUNTER — Inpatient Hospital Stay: Payer: Medicare HMO | Admitting: Certified Registered Nurse Anesthetist

## 2021-08-24 ENCOUNTER — Encounter
Admission: EM | Disposition: A | Discharge status: Home / Self Care | Payer: Self-pay | Source: Home / Self Care | Attending: Internal Medicine

## 2021-08-24 ENCOUNTER — Other Ambulatory Visit: Payer: Self-pay

## 2021-08-24 ENCOUNTER — Encounter: Payer: Self-pay | Admitting: Internal Medicine

## 2021-08-24 DIAGNOSIS — I10 Essential (primary) hypertension: Secondary | ICD-10-CM | POA: Diagnosis not present

## 2021-08-24 DIAGNOSIS — E039 Hypothyroidism, unspecified: Secondary | ICD-10-CM | POA: Diagnosis not present

## 2021-08-24 DIAGNOSIS — K529 Noninfective gastroenteritis and colitis, unspecified: Secondary | ICD-10-CM | POA: Diagnosis not present

## 2021-08-24 DIAGNOSIS — F418 Other specified anxiety disorders: Secondary | ICD-10-CM | POA: Diagnosis not present

## 2021-08-24 HISTORY — PX: COLONOSCOPY: SHX5424

## 2021-08-24 LAB — GLUCOSE, CAPILLARY: Glucose-Capillary: 80 mg/dL (ref 70–99)

## 2021-08-24 SURGERY — COLONOSCOPY
Anesthesia: General

## 2021-08-24 MED ORDER — LIDOCAINE HCL (CARDIAC) PF 100 MG/5ML IV SOSY
PREFILLED_SYRINGE | INTRAVENOUS | Status: DC | PRN
  Administered 2021-08-24: 60 mg via INTRAVENOUS

## 2021-08-24 MED ORDER — PROPOFOL 500 MG/50ML IV EMUL
INTRAVENOUS | Status: DC | PRN
  Administered 2021-08-24: 130 ug/kg/min via INTRAVENOUS

## 2021-08-24 MED ORDER — PROPOFOL 10 MG/ML IV BOLUS
INTRAVENOUS | Status: DC | PRN
  Administered 2021-08-24 (×2): 20 mg via INTRAVENOUS
  Administered 2021-08-24: 50 mg via INTRAVENOUS

## 2021-08-24 MED ORDER — SODIUM CHLORIDE 0.9 % IV SOLN: INTRAVENOUS | Status: DC

## 2021-08-24 MED ORDER — EPHEDRINE SULFATE (PRESSORS) 50 MG/ML IJ SOLN
INTRAMUSCULAR | Status: DC | PRN
  Administered 2021-08-24: 5 mg via INTRAVENOUS

## 2021-08-24 NOTE — Anesthesia Preprocedure Evaluation (Signed)
Anesthesia Evaluation  ?Patient identified by MRN, date of birth, ID band ?Patient awake ? ? ? ?Reviewed: ?Allergy & Precautions, H&P , NPO status , Patient's Chart, lab work & pertinent test results, reviewed documented beta blocker date and time  ? ?Airway ?Mallampati: II ? ? ?Neck ROM: full ? ? ? Dental ? ?(+) Poor Dentition ?  ?Pulmonary ?neg pulmonary ROS,  ?  ?Pulmonary exam normal ? ? ? ? ? ? ? Cardiovascular ?Exercise Tolerance: Poor ?hypertension, On Medications ?+ CAD and + Past MI  ?Normal cardiovascular exam+ dysrhythmias  ?Rhythm:regular Rate:Normal ? ? ?  ?Neuro/Psych ?PSYCHIATRIC DISORDERS Anxiety Depression negative neurological ROS ?   ? GI/Hepatic ?negative GI ROS, Neg liver ROS,   ?Endo/Other  ?Hypothyroidism  ? Renal/GU ?negative Renal ROS  ?negative genitourinary ?  ?Musculoskeletal ? ? Abdominal ?  ?Peds ? Hematology ?negative hematology ROS ?(+)   ?Anesthesia Other Findings ?Past Medical History: ?No date: Hypertension ?No date: Hypothyroidism ?No date: Mitral regurgitation ?No date: PVC's (premature ventricular contractions) ?No date: Right bundle branch block ?No date: Seasonal allergies ?Past Surgical History: ?No date: ABDOMINAL HYSTERECTOMY ?04/27/2016: BREAST BIOPSY; Left ?    Comment:  FIBROADENOMATOUS CHANGES WITH STROMAL HYALINIZATION AND  ?No date: CATARACT EXTRACTION ?07/02/2018: LEFT HEART CATH AND CORONARY ANGIOGRAPHY; N/A ?    Comment:  Procedure: LEFT HEART CATH AND CORONARY ANGIOGRAPHY;   ?             Surgeon: Lamar Blinks, MD;  Location: [facility] INVASIVE  ?             CV LAB;  Service: Cardiovascular;  Laterality: N/A; ?No date: Right elbow tendonitis ?No date: ROTATOR CUFF REPAIR; Bilateral ?BMI   ? Body Mass Index: 22.82 kg/m?  ?  ? Reproductive/Obstetrics ?negative OB ROS ? ?  ? ? ? ? ? ? ? ? ? ? ? ? ? ?  ?  ? ? ? ? ? ? ? ? ?Anesthesia Physical ?Anesthesia Plan ? ?ASA: 3 ? ?Anesthesia Plan: General  ? ?Post-op Pain Management:    ? ?Induction:  ? ?PONV Risk Score and Plan:  ? ?Airway Management Planned:  ? ?Additional Equipment:  ? ?Intra-op Plan:  ? ?Post-operative Plan:  ? ?Informed Consent: I have reviewed the patients History and Physical, chart, labs and discussed the procedure including the risks, benefits and alternatives for the proposed anesthesia with the patient or authorized representative who has indicated his/her understanding and acceptance.  ? ? ? ?Dental Advisory Given ? ?Plan Discussed with: CRNA ? ?Anesthesia Plan Comments:   ? ? ? ? ? ? ?Anesthesia Quick Evaluation ? ?

## 2021-08-24 NOTE — Progress Notes (Signed)
Per Dr. Sherryll Burger okay to discontinue enteric precautions due to GI panel and Cdiff coming back negative. ?

## 2021-08-24 NOTE — Progress Notes (Signed)
?  Progress Note ? ? ?PatientMadelline Eshbach [name] ZYS:[id number] DOB: 12-12-1934 DOA: 08/22/2021     2 ?DOS: the patient was seen and examined on 08/24/2021 ?  ?Brief hospital course: ?[place] with65 year old female with past medical history of hypertension, CAD, depression ?Mitral valve regurg presented to the emergency room on 5/7 with complaints of bloody diarrhea and abdominal pain x1 day.  Work-up revealed colitis.  Patient was given to the hospitalist service and started on IV fluids and antibiotics. ? ?5/9: Colonoscopy showed internal hemorrhoids, diverticulosis ? ? ?Assessment and Plan: ?* Colitis, acute ?Patient with bloody diarrhea and abdominal pain.  CT with sigmoid colitis.  Bleeding diarrhea have since stopped.  No abdominal pain currently.  Continue IV antibiotics.  GI consulted and plans to do colonoscopy on 5/9.  Colonoscopy showed internal hemorrhoid which prolapsed easily with straining, diverticulosis. Congested mucosa in the sigmoid colon which were biopsied. ?Advance to regular diet ? ?Hypertension ?-IV hydralazine as needed ?-Continue amlodipine, lisinopril, metoprolol ? ?Depression with anxiety ?- Continue as needed Xanax ? ?Hypothyroidism ?- Continue Synthroid ? ?CAD (coronary artery disease) ?-Holding aspirin due to GI bleeding ? ? ? ? ?  ? ?Subjective: Hoping to get colonoscopy done so that she can go home soon.  Denies any further symptoms ? ?Physical Exam: ?Vitals:  ? 08/24/21 1348 08/24/21 1350 08/24/21 1403 08/24/21 1405  ?BP: (!) 152/53 (!) 136/96 (!) 148/63 (!) 148/63  ?Pulse:      ?Resp:      ?Temp: (!) 96.6 ?F (35.9 ?C)     ?TempSrc: Temporal     ?SpO2:      ?Weight:      ?Height:      ? ?? General: Alert and oriented x3, no acute distress ?? HEENT: Normocephalic and atraumatic, mucous membranes are moist ?? Cardiovascular: Regular rate and rhythm, S1-S2 ?? Respiratory: Clear to auscultation bilaterally ?? Abdomen: Soft, nontender, nondistended, positive bowel sounds ?? Musculoskeletal: No  clubbing or cyanosis or edema ?? Skin: No skin breaks, tears or lesions ?? Psychiatry: Appropriate, no evidence of psychoses ?? Neurology: No focal deficits ??  ?Data Reviewed: ? ?Colonoscopy findings as above ? ?Family Communication: None ? ?Disposition: ?Status is: Inpatient ?Remains inpatient appropriate because: Had colonoscopy earlier ? ? Planned Discharge Destination: Home ? ? ? DVT prophylaxis- SCDs ?Time spent: 35 minutes ? ?Author: ?Delfino Lovett, MD ?08/24/2021 5:50 PM ? ?For on call review [email or web address].  ?

## 2021-08-24 NOTE — Progress Notes (Signed)
Pt is injury free, afebrile, alert, and oriented x 4. Pt been NPO since midnight for procedure. Pt's stool is watery and clear post bowel prep.  ?

## 2021-08-24 NOTE — Transfer of Care (Signed)
Immediate Anesthesia Transfer of Care Note ? ?Patient: Michele [name] ? ?Procedure(s) Performed: COLONOSCOPY ? ?Patient Location: PACU ? ?Anesthesia Type:General ? ?Level of Consciousness: awake and alert  ? ?Airway & Oxygen Therapy: Patient Spontanous Breathing ? ?Post-op Assessment: Report given to RN and Post -op Vital signs reviewed and stable ? ?Post vital signs: Reviewed and stable ? ?Last Vitals:  ?Vitals Value Taken Time  ?BP 152/53 08/24/21 1343  ?Temp    ?Pulse 69 08/24/21 1344  ?Resp 18 08/24/21 1344  ?SpO2 100 % 08/24/21 1344  ?Vitals shown include unvalidated device data. ? ?Last Pain:  ?Vitals:  ? 08/24/21 1221  ?TempSrc: Temporal  ?PainSc: 0-No pain  ?   ? ?  ? ?Complications: No notable events documented. ?

## 2021-08-24 NOTE — Anesthesia Procedure Notes (Signed)
Date/Time: 08/24/2021 1:15 PM ?Performed by: Malva Cogan, CRNA ?Pre-anesthesia Checklist: Patient identified, Emergency Drugs available, Suction available, Patient being monitored and Timeout performed ?Patient Re-evaluated:Patient Re-evaluated prior to induction ?Oxygen Delivery Method: Nasal cannula ?Induction Type: IV induction ?Placement Confirmation: CO2 detector and positive ETCO2 ? ? ? ? ?

## 2021-08-24 NOTE — Progress Notes (Signed)
Mobility Specialist - Progress Note ? ? 08/24/21 1428  ?Mobility  ?Activity Contraindicated/medical hold;Off unit  ? ? ? ?Pt off unit at time of arrival for Colonoscopy procedure. Will attempt at a later date and time. ? ?Clarisa Schools ?Mobility Specialist ?08/24/21, 2:29 PM ? ? ? ? ?

## 2021-08-24 NOTE — Op Note (Signed)
[facility] ?Gastroenterology ?Patient Name: Michele [name] ?Procedure Date: 08/24/2021 12:52 PM ?MRN: [id number] ?Account #: [id number] ?Date of Birth: May 03, 1934 ?Admit Type: Inpatient ?Age: 87 ?Room: [facility] ENDO ROOM 2 ?Gender: Female ?Note Status: Finalized ?Instrument Name: Colonoscope [id number] ?Procedure:             Colonoscopy ?Indications:           Abdominal pain in the left lower quadrant,  ?                       Hematochezia, Abnormal CT of the GI tract ?Providers:             Benay Pike. [name] MD, MD ?Referring MD:          Leonie Douglas. Doy Hutching, MD (Referring MD) ?Medicines:             Propofol per Anesthesia ?Complications:         No immediate complications. ?Procedure:             Pre-Anesthesia Assessment: ?                       - The risks and benefits of the procedure and the  ?                       sedation options and risks were discussed with the  ?                       patient. All questions were answered and informed  ?                       consent was obtained. ?                       - Patient identification and proposed procedure were  ?                       verified prior to the procedure by the nurse. The  ?                       procedure was verified in the procedure room. ?                       - ASA Grade Assessment: III - A patient with severe  ?                       systemic disease. ?                       - After reviewing the risks and benefits, the patient  ?                       was deemed in satisfactory condition to undergo the  ?                       procedure. ?                       After obtaining informed consent, the colonoscope was  ?  passed under direct vision. Throughout the procedure,  ?                       the patient's blood pressure, pulse, and oxygen  ?                       saturations were monitored continuously. The  ?                       Colonoscope was introduced through the anus and  ?                        advanced to the the cecum, identified by appendiceal  ?                       orifice and ileocecal valve. The colonoscopy was  ?                       technically difficult and complex due to a tortuous  ?                       colon. Successful completion of the procedure was  ?                       aided by withdrawing the scope and replacing with the  ?                       pediatric colonoscope. The patient tolerated the  ?                       procedure well. The quality of the bowel preparation  ?                       was adequate. The ileocecal valve, appendiceal  ?                       orifice, and rectum were photographed. ?Findings: ?     The perianal exam findings include internal hemorrhoids that prolapse  ?     with straining, but spontaneously regress to the resting position (Grade  ?     II). ?     The digital rectal exam was normal. Pertinent negatives include normal  ?     sphincter tone. ?     Multiple small and large-mouthed diverticula were found in the sigmoid  ?     colon. ?     An area of moderately congested mucosa was found in the sigmoid colon.  ?     Biopsies were taken with a cold forceps for histology. ?     The exam was otherwise without abnormality. ?Impression:            - Internal hemorrhoids that prolapse with straining,  ?                       but spontaneously regress to the resting position  ?                       (Grade II) found on perianal exam. ?                       -  Diverticulosis in the sigmoid colon. ?                       - Congested mucosa in the sigmoid colon. Biopsied. ?                       - The examination was otherwise normal. ?Recommendation:        - Return patient to hospital ward for possible  ?                       discharge same day. ?                       - Await pathology results. ?                       - Advance diet as tolerated. ?                       - Continue present medications. ?                       - Okay to discharge from  hospital from a GI  ?                       standpoint. Follow up with GI as needed. ?Procedure Code(s):     --- Professional --- ?                       [phone], Colonoscopy, flexible; with biopsy, single or  ?                       multiple ?Diagnosis Code(s):     --- Professional --- ?                       R93.3, Abnormal findings on diagnostic imaging of  ?                       other parts of digestive tract ?                       K57.30, Diverticulosis of large intestine without  ?                       perforation or abscess without bleeding ?                       K92.1, Melena (includes Hematochezia) ?                       R10.32, Left lower quadrant pain ?                       K63.89, Other specified diseases of intestine ?                       K64.1, Second degree hemorrhoids ?CPT copyright 2019 [employer] Association. All rights reserved. ?The codes documented in this report are preliminary and upon coder review may  ?be revised to meet current compliance requirements. ?Efrain Sella MD, MD ?08/24/2021 1:45:40 PM ?This report has been signed electronically. ?Number  of Addenda: 0 ?Note Initiated On: 08/24/2021 12:52 PM ?Scope Withdrawal Time: 0 hours 5 minutes 40 seconds  ?Total Procedure Duration: 0 hours 19 minutes 28 seconds  ?Estimated Blood Loss:  Estimated blood loss: none. ?     [facility] ?

## 2021-08-24 NOTE — Plan of Care (Signed)

## 2021-08-25 ENCOUNTER — Encounter: Payer: Self-pay | Admitting: Internal Medicine

## 2021-08-25 DIAGNOSIS — K529 Noninfective gastroenteritis and colitis, unspecified: Secondary | ICD-10-CM | POA: Diagnosis not present

## 2021-08-25 DIAGNOSIS — A09 Infectious gastroenteritis and colitis, unspecified: Secondary | ICD-10-CM

## 2021-08-25 DIAGNOSIS — K922 Gastrointestinal hemorrhage, unspecified: Secondary | ICD-10-CM | POA: Diagnosis not present

## 2021-08-25 DIAGNOSIS — I10 Essential (primary) hypertension: Secondary | ICD-10-CM | POA: Diagnosis not present

## 2021-08-25 LAB — CBC
HCT: 31.9 % — ABNORMAL LOW (ref 36.0–46.0)
Hemoglobin: 10.6 g/dL — ABNORMAL LOW (ref 12.0–15.0)
MCH: 28.3 pg (ref 26.0–34.0)
MCHC: 33.2 g/dL (ref 30.0–36.0)
MCV: 85.3 fL (ref 80.0–100.0)
Platelets: 159 10*3/uL (ref 150–400)
RBC: 3.74 MIL/uL — ABNORMAL LOW (ref 3.87–5.11)
RDW: 12.9 % (ref 11.5–15.5)
WBC: 4.6 10*3/uL (ref 4.0–10.5)
nRBC: 0 % (ref 0.0–0.2)

## 2021-08-25 LAB — GLUCOSE, CAPILLARY: Glucose-Capillary: 76 mg/dL (ref 70–99)

## 2021-08-25 LAB — SURGICAL PATHOLOGY

## 2021-08-25 MED ORDER — METRONIDAZOLE 500 MG PO TABS: 500.0000 mg | ORAL_TABLET | Freq: Three times a day (TID) | ORAL | 0 refills | Status: AC

## 2021-08-25 MED ORDER — CIPROFLOXACIN HCL 500 MG PO TABS: 500.0000 mg | ORAL_TABLET | Freq: Two times a day (BID) | ORAL | 0 refills | Status: AC

## 2021-08-25 NOTE — Anesthesia Postprocedure Evaluation (Signed)
Anesthesia Post Note ? ?Patient: Michele [name] ? ?Procedure(s) Performed: COLONOSCOPY ? ?Patient location during evaluation: PACU ?Anesthesia Type: General ?Level of consciousness: awake and alert ?Pain management: pain level controlled ?Vital Signs Assessment: post-procedure vital signs reviewed and stable ?Respiratory status: spontaneous breathing, nonlabored ventilation, respiratory function stable and patient connected to nasal cannula oxygen ?Cardiovascular status: blood pressure returned to baseline and stable ?Postop Assessment: no apparent nausea or vomiting ?Anesthetic complications: no ? ? ?No notable events documented. ? ? ?Last Vitals:  ?Vitals:  ? 08/25/21 0448 08/25/21 0811  ?BP: (!) 146/60 (!) 149/61  ?Pulse: 81 83  ?Resp: 18 16  ?Temp: 36.7 ?C 36.9 ?C  ?SpO2: 98% 99%  ?  ?Last Pain:  ?Vitals:  ? 08/25/21 0917  ?TempSrc:   ?PainSc: 0-No pain  ? ? ?  ?  ?  ?  ?  ?  ? ?Yevette Edwards ? ? ? ? ?

## 2021-08-25 NOTE — Progress Notes (Signed)
AVS reviewed with pt and sister at bedside; all questions answered. PIV removed; pt without s/s of distress. Volunteer services transported pt via w/c to POV. ?

## 2021-08-26 NOTE — Discharge Summary (Signed)
?Physician Discharge Summary ?  ?Patient: Michele [name] MRN: [id number] DOB: October 08, 1934  ?Admit date:     08/22/2021  ?Discharge date: 08/25/2021  ?Discharge Physician: Delfino LovettVipul Ilaria Much  ? ?PCP: Marguarite ArbourSparks, Jeffrey D, MD  ? ?Recommendations at discharge:  ? ?Follow-up with outpatient providers as requested ? ?Discharge Diagnoses: ?Principal Problem: ?  Infectious colitis ?Active Problems: ?  Hypertension ?  Depression with anxiety ?  Hypothyroidism ?  CAD (coronary artery disease) ?  Lower GI bleed ? ? ?Hospital Course: ?[place] with[age] year old female with past medical history of hypertension, CAD, depression ?Mitral valve regurg presented to the emergency room on 5/7 with complaints of bloody diarrhea and abdominal pain x1 day.  Work-up revealed colitis.  Patient was given to the hospitalist service and started on IV fluids and antibiotics. ? ?5/9: Colonoscopy showed internal hemorrhoids, diverticulosis ? ?Assessment and Plan: ?* Infectious colitis ?Patient with bloody diarrhea and abdominal pain.  CT with sigmoid colitis.  Bleeding diarrhea have since stopped.  No abdominal pain currently.  Treated with IV antibiotics while in the hospital.  ?-Status post colonoscopy on 5/9 - showed internal hemorrhoid which prolapsed easily with straining, diverticulosis. Congested mucosa in the sigmoid colon which were biopsied. ?-With no further bleeding and tolerating regular diet.  She was discharged home in stable condition ? ?Hypertension ?Controlled on home regimen ? ?Depression with anxiety ?Hypothyroidism ?- Continue Synthroid ?CAD (coronary artery disease) ?-Holding aspirin due to GI bleeding while in the hospital ?This can be resumed at discharge.  Patient was instructed to hold aspirin if she starts having any further bleeding at home ? ? ? ? ?  ? ? ?Consultants: GI ?Procedures performed: Colonoscopy on 5/9 ?Disposition: Home ?Diet recommendation:  ?Discharge Diet Orders (From admission, onward)  ? ?  Start     Ordered  ? 08/25/21  0000  Diet - low sodium heart healthy       ? 08/25/21 0957  ? ?  ?  ? ?  ? ?Carb modified diet ?DISCHARGE MEDICATION: ?Allergies as of 08/25/2021   ? ?   Reactions  ? Oxycodone-acetaminophen Other (See Comments)  ? Also constipation   ? ?  ? ?  ?Medication List  ?  ? ?TAKE these medications   ? ?ALPRAZolam 0.25 MG tablet ?Commonly known as: Prudy FeelerXANAX ?Take 1 tablet (0.25 mg total) by mouth 2 (two) times daily as needed for anxiety. ?  ?amLODipine 10 MG tablet ?Commonly known as: NORVASC ?Take 10 mg by mouth daily. ?  ?aspirin EC 81 MG tablet ?Take 81 mg by mouth daily. ?  ?ciprofloxacin 500 MG tablet ?Commonly known as: Cipro ?Take 1 tablet (500 mg total) by mouth 2 (two) times daily for 5 days. ?  ?levothyroxine 75 MCG tablet ?Commonly known as: SYNTHROID ?Take 75 mcg by mouth daily. ?  ?lisinopril 5 MG tablet ?Commonly known as: ZESTRIL ?Take 1 tablet (5 mg total) by mouth daily. ?  ?metoprolol tartrate 25 MG tablet ?Commonly known as: LOPRESSOR ?Take 1 tablet (25 mg total) by mouth 2 (two) times daily. ?  ?metroNIDAZOLE 500 MG tablet ?Commonly known as: Flagyl ?Take 1 tablet (500 mg total) by mouth 3 (three) times daily for 5 days. ?  ?MULTIVITAMIN ADULT PO ?Take 1 tablet by mouth daily. ?  ?PRESERVISION AREDS 2 PO ?Take 1 capsule by mouth daily. ?  ?Prevagen 10 MG Caps ?Generic drug: Apoaequorin ?Take 1 tablet by mouth daily. ?  ? ?  ? ? Follow-up Information   ? ? Sparks,  Duane Lope, MD. Go on 08/30/2021.   ?Specialty: Internal Medicine ?Why: 3:45pm ?Contact information: ?[address] ?[facility] ?[place] [address] ?[phone] ? ? ?  ?  ? ? [place], Boykin Nearing, MD. Schedule an appointment as soon as possible for a visit in 2 week(s).   ?Specialty: Gastroenterology ?Why: You will be contacted for appt. [facility] Discharge F/UP ?Contact information: ?[address] ?[place] [address] ?[phone] ? ? ?  ?  ? ?  ?  ? ?  ? ?Discharge Exam: ?Filed Weights  ? 08/22/21 4709 08/22/21 1600  08/24/21 1221  ?Weight: 60.3 kg 60.3 kg 60.3 kg  ? ?General: Alert and oriented x3, no acute distress ?HEENT: Normocephalic and atraumatic, mucous membranes are moist ?Cardiovascular: Regular rate and rhythm, S1-S2 ?Respiratory: Clear to auscultation bilaterally ?Abdomen: Soft, nontender, nondistended, positive bowel sounds ?Musculoskeletal: No clubbing or cyanosis or edema ?Skin: No skin breaks, tears or lesions ?Psychiatry: Appropriate, no evidence of psychoses ?Neurology: No focal deficits ? ?Condition at discharge: good ? ?The results of significant diagnostics from this hospitalization (including imaging, microbiology, ancillary and laboratory) are listed below for reference.  ? ?Imaging Studies: ?CT ABDOMEN PELVIS W CONTRAST ? ?Result Date: 08/22/2021 ?CLINICAL DATA:  Left lower quadrant pain and hematochezia beginning yesterday. EXAM: CT ABDOMEN AND PELVIS WITH CONTRAST TECHNIQUE: Multidetector CT imaging of the abdomen and pelvis was performed using the standard protocol following bolus administration of intravenous contrast. RADIATION DOSE REDUCTION: This exam was performed according to the departmental dose-optimization program which includes automated exposure control, adjustment of the mA and/or kV according to patient size and/or use of iterative reconstruction technique. CONTRAST:  OMNIPAQUE IOHEXOL 300 MG/ML  SOLN COMPARISON:  None Available. FINDINGS: Lower Chest: No acute findings. Hepatobiliary: No hepatic masses identified. A few small cysts are seen mainly in the left hepatic lobe. Gallbladder is unremarkable. No evidence of biliary ductal dilatation. Pancreas:  No mass or inflammatory changes. Spleen: Within normal limits in size and appearance. Adrenals/Urinary Tract: No masses identified. No evidence of ureteral calculi or hydronephrosis. Stomach/Bowel: Moderate colonic wall thickening and pericolonic soft tissue stranding seen throughout the descending and sigmoid colon, consistent with  colitis. No evidence of bowel perforation or abscess. Diverticulosis noted in the distal sigmoid colon. Small amount of free fluid noted in pelvic cul-de-sac. Vascular/Lymphatic: No pathologically enlarged lymph nodes. No acute vascular findings. Aortic atherosclerotic calcification noted. Reproductive: Prior hysterectomy noted. Adnexal regions are unremarkable in appearance. Other:  None. Musculoskeletal:  No suspicious bone lesions identified. IMPRESSION: Moderate colitis involving the descending and sigmoid colon. No evidence of bowel perforation or abscess. Aortic Atherosclerosis (ICD10-I70.0). Electronically Signed   By: Danae Orleans M.D.   On: 08/22/2021 11:13   ? ?Microbiology: ?Results for orders placed or performed during the hospital encounter of 08/22/21  ?Blood culture (single)     Status: None (Preliminary result)  ? Collection Time: 08/22/21 10:20 AM  ? Specimen: BLOOD  ?Result Value Ref Range Status  ? Specimen Description BLOOD BLOOD RIGHT WRIST  Final  ? Special Requests   Final  ?  BOTTLES DRAWN AEROBIC AND ANAEROBIC Blood Culture adequate volume  ? Culture   Final  ?  NO GROWTH 4 DAYS ?Performed at [facility], [address], [place], [place] [address] ?  ? Report Status PENDING  Incomplete  ?C Difficile Quick Screen w PCR reflex     Status: None  ? Collection Time: 08/22/21 12:29 PM  ? Specimen: STOOL  ?Result  Value Ref Range Status  ? C Diff antigen NEGATIVE NEGATIVE Final  ? C Diff toxin NEGATIVE NEGATIVE Final  ? C Diff interpretation No C. difficile detected.  Final  ?  Comment: Performed at [facility], [address] [place], [place] [address]  ?Gastrointestinal Panel by PCR , Stool     Status: None  ? Collection Time: 08/22/21 12:29 PM  ? Specimen: Stool  ?Result Value Ref Range Status  ? Campylobacter species NOT DETECTED NOT DETECTED Final  ? Plesimonas shigelloides NOT DETECTED NOT DETECTED Final  ? Salmonella species NOT DETECTED NOT DETECTED Final  ? Yersinia  enterocolitica NOT DETECTED NOT DETECTED Final  ? Vibrio species NOT DETECTED NOT DETECTED Final  ? Vibrio cholerae NOT DETECTED NOT DETECTED Final  ? Enteroaggregative E coli (EAEC) NOT DETECTED NOT DETE

## 2021-08-27 LAB — CULTURE, BLOOD (SINGLE)
Culture: NO GROWTH
Special Requests: ADEQUATE

## 2021-09-17 ENCOUNTER — Ambulatory Visit
Admission: RE | Admit: 2021-09-17 | Discharge: 2021-09-17 | Disposition: A | Discharge status: Home / Self Care | Payer: Medicare HMO | Source: Ambulatory Visit | Attending: Internal Medicine | Admitting: Internal Medicine

## 2021-09-17 DIAGNOSIS — Z1231 Encounter for screening mammogram for malignant neoplasm of breast: Secondary | ICD-10-CM | POA: Diagnosis not present

## 2022-01-21 ENCOUNTER — Other Ambulatory Visit: Payer: Self-pay | Admitting: Orthopedic Surgery

## 2022-01-21 DIAGNOSIS — M7711 Lateral epicondylitis, right elbow: Secondary | ICD-10-CM

## 2022-02-11 ENCOUNTER — Ambulatory Visit
Admission: RE | Admit: 2022-02-11 | Discharge: 2022-02-11 | Disposition: A | Discharge status: Home / Self Care | Payer: Medicare HMO | Source: Ambulatory Visit | Attending: Orthopedic Surgery | Admitting: Orthopedic Surgery

## 2022-02-11 DIAGNOSIS — M7711 Lateral epicondylitis, right elbow: Secondary | ICD-10-CM

## 2022-09-27 ENCOUNTER — Other Ambulatory Visit: Payer: Self-pay

## 2022-09-27 DIAGNOSIS — Z1231 Encounter for screening mammogram for malignant neoplasm of breast: Secondary | ICD-10-CM

## 2022-10-12 ENCOUNTER — Ambulatory Visit
Admission: RE | Admit: 2022-10-12 | Discharge: 2022-10-12 | Disposition: A | Discharge status: Home / Self Care | Payer: Medicare HMO | Source: Ambulatory Visit | Attending: Internal Medicine | Admitting: Internal Medicine

## 2022-10-12 DIAGNOSIS — Z1231 Encounter for screening mammogram for malignant neoplasm of breast: Secondary | ICD-10-CM | POA: Diagnosis present

## 2023-10-18 ENCOUNTER — Other Ambulatory Visit: Payer: Self-pay

## 2023-10-18 ENCOUNTER — Emergency Department
Admission: EM | Admit: 2023-10-18 | Discharge: 2023-10-18 | Disposition: A | Discharge status: Home / Self Care | Attending: Emergency Medicine | Admitting: Emergency Medicine

## 2023-10-18 ENCOUNTER — Encounter: Payer: Self-pay | Admitting: Emergency Medicine

## 2023-10-18 DIAGNOSIS — E039 Hypothyroidism, unspecified: Secondary | ICD-10-CM | POA: Diagnosis not present

## 2023-10-18 DIAGNOSIS — I10 Essential (primary) hypertension: Secondary | ICD-10-CM | POA: Insufficient documentation

## 2023-10-18 DIAGNOSIS — M5442 Lumbago with sciatica, left side: Secondary | ICD-10-CM | POA: Diagnosis not present

## 2023-10-18 DIAGNOSIS — M5432 Sciatica, left side: Secondary | ICD-10-CM

## 2023-10-18 DIAGNOSIS — M545 Low back pain, unspecified: Secondary | ICD-10-CM | POA: Diagnosis present

## 2023-10-18 NOTE — ED Provider Notes (Signed)
 [facility] Provider Note    Event Date/Time   First MD Initiated Contact with Patient 10/18/23 [phone]     (approximate)   History   Back Pain   HPI  Michele [name] is a 89 y.o. female with PMH of hypertension, right bundle branch block, mitral regurgitation and hypothyroidism who presents for evaluation of left lower back pain that radiates down her left leg.  Patient states that the pain has been intermittent over the past 2 weeks.  No falls, trauma or specific injury that she is aware of.  Patient denies red flag symptoms including fever, saddle anesthesia and changes in bladder and bowel function.  She denies numbness and weakness in the leg.  She is taken Tylenol  to manage her pain.  The pain woke her up this morning so she decided to come to the ED for evaluation.  She is not having pain at this time.     Physical Exam   Triage Vital Signs: ED Triage Vitals  Encounter Vitals Group     BP 10/18/23 0819 (!) 191/77     Girls Systolic BP Percentile --      Girls Diastolic BP Percentile --      Boys Systolic BP Percentile --      Boys Diastolic BP Percentile --      Pulse Rate 10/18/23 0819 79     Resp 10/18/23 0819 17     Temp 10/18/23 0819 98.2 F (36.8 C)     Temp Source 10/18/23 0819 Oral     SpO2 10/18/23 0819 98 %     Weight 10/18/23 0817 152 lb (68.9 kg)     Height 10/18/23 0817 5' 5 (1.651 m)     Head Circumference --      Peak Flow --      Pain Score 10/18/23 0817 8     Pain Loc --      Pain Education --      Exclude from Growth Chart --     Most recent vital signs: Vitals:   10/18/23 0819  BP: (!) 191/77  Pulse: 79  Resp: 17  Temp: 98.2 F (36.8 C)  SpO2: 98%    General: Awake, no distress.  CV:  Good peripheral perfusion.  RRR. Resp:  Normal effort.  CTAB. Abd:  No distention.  Other:  No tenderness to palpation over the spine, mild tenderness in the left lower paraspinal muscles.  Dorsalis pedis pulse is 2+ and regular  bilaterally.  5/5 strength in bilateral lower extremities.  Patellar tendon reflex 2+ bilaterally.  Positive straight leg raise on the left side.   ED Results / Procedures / Treatments   Labs (all labs ordered are listed, but only abnormal results are displayed) Labs Reviewed - No data to display   PROCEDURES:  Critical Care performed: No  Procedures   MEDICATIONS ORDERED IN ED: Medications - No data to display   IMPRESSION / MDM / ASSESSMENT AND PLAN / ED COURSE  I reviewed the triage vital signs and the nursing notes.                             89 year old female presents for evaluation of left lower back pain.  Blood pressure is elevated but patient does have history of hypertension.  Vital signs stable otherwise.  Patient NAD on exam.  Differential diagnosis includes, but is not limited to, muscle strain, sciatica, disc  herniation, spinal stenosis, less likely compression fracture and acute cord compression.  Patient's presentation is most consistent with acute, uncomplicated illness.  Did consider imaging, however patient has not had any falls or trauma and does not present with any red flag symptoms so felt this would be low yield.  Patient has a positive straight leg raise on the left side consistent with a spinal nerve root compression.  Offered topical pain control with lidocaine  patches, anti-inflammatory treatment with meloxicam and OTC pain management with Tylenol .  Patient states that she prefers to not take additional medication and that her pain has been well-managed with Tylenol .  She declined the lidocaine  patches and meloxicam.  Will give her follow-up information for neurosurgery.  Advised patient to schedule an appointment if her pain continues.  Patient states that she mostly came for reassurance that nothing serious was wrong.  Do not feel that further emergent workup is warranted at this time.  Patient is stable for outpatient management.  She voiced  understanding, all questions were answered and she is stable at discharge.      FINAL CLINICAL IMPRESSION(S) / ED DIAGNOSES   Final diagnoses:  Sciatica of left side     Rx / DC Orders   ED Discharge Orders     None        Note:  This document was prepared using Dragon voice recognition software and may include unintentional dictation errors.   Cleaster Tinnie LABOR, PA-C 10/18/23 9095    Jacolyn Pae, MD 10/18/23 1538

## 2023-10-18 NOTE — Discharge Instructions (Signed)
 I believe your pain is a result of sciatica, which is irritation of the nerve that travels down the back of your leg.  I have attached information about sciatica as well as exercises for you to do at home to help improve your pain.  You can continue to treat your pain with only Tylenol  if that has been effective.  You can also take 600 mg of ibuprofen every 6 hours as needed for additional pain control.  Furthermore, you can use topical pain relievers like lidocaine  patches or muscle creams as well as ice and heat.  I have attached information for neurosurgery follow-up.  Call the office to schedule an appointment if your pain continues.

## 2023-10-18 NOTE — ED Triage Notes (Signed)
 Patient to ED via POV for left lower back pain. Ongoing x2 weeks. Denies injury. Pain radiates down left leg. Ambulatory to triage.

## 2023-11-20 ENCOUNTER — Other Ambulatory Visit: Payer: Self-pay | Admitting: Internal Medicine

## 2023-11-20 DIAGNOSIS — Z1231 Encounter for screening mammogram for malignant neoplasm of breast: Secondary | ICD-10-CM

## 2023-11-22 ENCOUNTER — Ambulatory Visit
Admission: RE | Admit: 2023-11-22 | Discharge: 2023-11-22 | Disposition: A | Discharge status: Home / Self Care | Source: Ambulatory Visit | Attending: Internal Medicine | Admitting: Internal Medicine

## 2023-11-22 DIAGNOSIS — Z1231 Encounter for screening mammogram for malignant neoplasm of breast: Secondary | ICD-10-CM | POA: Insufficient documentation

## 2023-12-09 ENCOUNTER — Emergency Department
Admission: EM | Admit: 2023-12-09 | Discharge: 2023-12-09 | Disposition: A | Discharge status: Home / Self Care | Attending: Emergency Medicine | Admitting: Emergency Medicine

## 2023-12-09 ENCOUNTER — Other Ambulatory Visit: Payer: Self-pay

## 2023-12-09 ENCOUNTER — Emergency Department

## 2023-12-09 DIAGNOSIS — E039 Hypothyroidism, unspecified: Secondary | ICD-10-CM | POA: Insufficient documentation

## 2023-12-09 DIAGNOSIS — M545 Low back pain, unspecified: Secondary | ICD-10-CM | POA: Diagnosis present

## 2023-12-09 DIAGNOSIS — I10 Essential (primary) hypertension: Secondary | ICD-10-CM | POA: Insufficient documentation

## 2023-12-09 DIAGNOSIS — Z79899 Other long term (current) drug therapy: Secondary | ICD-10-CM | POA: Diagnosis not present

## 2023-12-09 DIAGNOSIS — M5417 Radiculopathy, lumbosacral region: Secondary | ICD-10-CM | POA: Diagnosis not present

## 2023-12-09 MED ORDER — METOPROLOL TARTRATE 25 MG PO TABS
25.0000 mg | ORAL_TABLET | Freq: Once | ORAL | Status: AC
  Administered 2023-12-09: 25 mg via ORAL
  Filled 2023-12-09: qty 1

## 2023-12-09 MED ORDER — METHYLPREDNISOLONE 4 MG PO TBPK: ORAL_TABLET | ORAL | 0 refills | Status: AC

## 2023-12-09 MED ORDER — ACETAMINOPHEN 325 MG PO TABS
650.0000 mg | ORAL_TABLET | Freq: Once | ORAL | Status: AC
  Administered 2023-12-09: 650 mg via ORAL
  Filled 2023-12-09: qty 2

## 2023-12-09 MED ORDER — AMLODIPINE BESYLATE 5 MG PO TABS
10.0000 mg | ORAL_TABLET | Freq: Once | ORAL | Status: AC
  Administered 2023-12-09: 10 mg via ORAL
  Filled 2023-12-09: qty 2

## 2023-12-09 NOTE — ED Triage Notes (Signed)
 Pt to ED for left sided back pain started Thursday, worsening, radiating down leg. Denies recent injuries. Ambulatory.   HTN on arrival, has not taken meds this am.

## 2023-12-09 NOTE — ED Provider Notes (Signed)
 [facility] Provider Note    Event Date/Time   First MD Initiated Contact with Patient 12/09/23 [phone]     (approximate)   History   Back Pain   HPI  Michele [name] is a 89 y.o. female history of hypothyroidism, hypertension, PVCs presents emergency department with low back pain.  Patient was also seen here in July for same complaint.  Did not get imaging at that time.  States she woke up this morning had pain in her lower back and decided just come to the emergency department.  Did not take Tylenol  or any of her medications.  States no pain when lying still.  Does not radiate to the front.  No UTI symptoms.      Physical Exam   Triage Vital Signs: ED Triage Vitals  Encounter Vitals Group     BP 12/09/23 0840 (!) 197/88     Girls Systolic BP Percentile --      Girls Diastolic BP Percentile --      Boys Systolic BP Percentile --      Boys Diastolic BP Percentile --      Pulse Rate 12/09/23 0840 69     Resp 12/09/23 0840 16     Temp 12/09/23 0839 97.8 F (36.6 C)     Temp src --      SpO2 12/09/23 0840 99 %     Weight 12/09/23 0839 152 lb (68.9 kg)     Height 12/09/23 0839 5' 6 (1.676 m)     Head Circumference --      Peak Flow --      Pain Score 12/09/23 0839 10     Pain Loc --      Pain Education --      Exclude from Growth Chart --     Most recent vital signs: Vitals:   12/09/23 0914 12/09/23 1015  BP: (!) 198/86 (!) 175/63  Pulse:    Resp:    Temp:    SpO2:       General: Awake, no distress.   CV:  Good peripheral perfusion.  Resp:  Normal effort.  Abd:  No distention.   Other:  Lumbar spine tender to palpation, pain is reproduced with movement, 5 or 5 strength lower extremities   ED Results / Procedures / Treatments   Labs (all labs ordered are listed, but only abnormal results are displayed) Labs Reviewed - No data to display   EKG     RADIOLOGY CT lumbar spine    PROCEDURES:   Procedures  Critical Care:   no Chief Complaint  Patient presents with   Back Pain      MEDICATIONS ORDERED IN ED: Medications  amLODipine  (NORVASC ) tablet 10 mg (10 mg Oral Given 12/09/23 0910)  metoprolol  tartrate (LOPRESSOR ) tablet 25 mg (25 mg Oral Given 12/09/23 0910)  acetaminophen  (TYLENOL ) tablet 650 mg (650 mg Oral Given 12/09/23 0910)     IMPRESSION / MDM / ASSESSMENT AND PLAN / ED COURSE  I reviewed the triage vital signs and the nursing notes.                              Differential diagnosis includes, but is not limited to, lumbar radiculopathy, compression fracture, arthritis, bone cancer, chronic pain  Patient's presentation is most consistent with acute illness / injury with system symptoms.   Cardiac monitor  Medications given: Metoprolol  25 mg p.o., amlodipine   10 mg p.o., Tylenol  650 mg p.o.  Patient's blood pressure is high at 197 88.  Patient has not taken her medications this morning.  Will go ahead and give her the amlodipine  and metoprolol  that she normally takes along with Tylenol  for pain.  Due to this being her second visit for the same complaint we will go ahead and do imaging.  Explained to her cannot see inside unless we do an image.  Patient is in agreement with this treatment plan.  CT lumbar spine ordered due to patient's age and most likely arthritis that would be evident on a plain x-ray.  Feel we need further advanced imaging due to this.   I did independently review and interpret the radiologist reading of the CT, CT of the lumbar spine shows several bulging disc and some radiculitis along the S1 nerve.    I did explain this finding to the patient.  She has no abdominal pain.  Pain is strictly with movement.  Do not feel there are any red flags to warrant further imaging at this time.  She was given a prescription for Medrol  Dosepak.  She is to continue take her regular medications including her blood pressure medicine that she did not take this morning.  Return emergency  department if worsening.  Discharged in stable condition.  Patient is in agreement treatment plan.   FINAL CLINICAL IMPRESSION(S) / ED DIAGNOSES   Final diagnoses:  Radiculitis, lumbosacral     Rx / DC Orders   ED Discharge Orders          Ordered    methylPREDNISolone  (MEDROL  DOSEPAK) 4 MG TBPK tablet        12/09/23 1017             Note:  This document was prepared using Dragon voice recognition software and may include unintentional dictation errors.    Gasper Devere ORN, PA-C 12/09/23 1443    Arlander Charleston, MD 12/09/23 [phone]

## 2023-12-09 NOTE — Discharge Instructions (Signed)
 Follow-up with your regular doctor if not improving in 2 to 3 days.  Return if worsening.  Take medication as prescribed.  You may also take Tylenol  for pain.  However while you are taking the methylprednisolone  do not take ibuprofen, Aleve etc.
